# Patient Record
Sex: Male | Born: 1980 | Race: White | Hispanic: No | Marital: Married | Smoking: Former smoker
Health system: Southern US, Community
[De-identification: ages and names within clinical notes are randomized; demographics above are authoritative.]

## PROBLEM LIST (undated history)

## (undated) DIAGNOSIS — Q433 Congenital malformations of intestinal fixation: Secondary | ICD-10-CM

## (undated) DIAGNOSIS — J301 Allergic rhinitis due to pollen: Secondary | ICD-10-CM

## (undated) DIAGNOSIS — K649 Unspecified hemorrhoids: Secondary | ICD-10-CM

## (undated) DIAGNOSIS — L723 Sebaceous cyst: Secondary | ICD-10-CM

## (undated) HISTORY — DX: Unspecified hemorrhoids: K64.9

## (undated) HISTORY — PX: COLON SURGERY: SHX602

## (undated) HISTORY — DX: Allergic rhinitis due to pollen: J30.1

## (undated) HISTORY — PX: VOLVULUS REDUCTION: SHX425

## (undated) HISTORY — DX: Sebaceous cyst: L72.3

## (undated) HISTORY — DX: Congenital malformations of intestinal fixation: Q43.3

## (undated) HISTORY — PX: APPENDECTOMY: SHX54

---

## 2008-05-12 ENCOUNTER — Ambulatory Visit: Payer: Self-pay | Admitting: Family Medicine

## 2008-05-12 DIAGNOSIS — L723 Sebaceous cyst: Secondary | ICD-10-CM

## 2008-05-12 DIAGNOSIS — K649 Unspecified hemorrhoids: Secondary | ICD-10-CM | POA: Insufficient documentation

## 2008-05-15 ENCOUNTER — Ambulatory Visit: Payer: Self-pay | Admitting: Family Medicine

## 2008-05-16 ENCOUNTER — Encounter (INDEPENDENT_AMBULATORY_CARE_PROVIDER_SITE_OTHER): Payer: Self-pay | Admitting: *Deleted

## 2008-05-16 LAB — CONVERTED CEMR LAB
CO2: 32 meq/L (ref 19–32)
Chloride: 103 meq/L (ref 96–112)
Creatinine, Ser: 1.3 mg/dL (ref 0.4–1.5)
GFR calc non Af Amer: 70 mL/min
Potassium: 4.1 meq/L (ref 3.5–5.1)
Total CHOL/HDL Ratio: 3.8
Triglycerides: 47 mg/dL (ref 0–149)

## 2008-11-16 ENCOUNTER — Ambulatory Visit: Payer: Self-pay | Admitting: Family Medicine

## 2008-11-16 LAB — CONVERTED CEMR LAB: Rapid Strep: NEGATIVE

## 2008-12-15 ENCOUNTER — Telehealth: Payer: Self-pay | Admitting: Family Medicine

## 2008-12-15 ENCOUNTER — Telehealth (INDEPENDENT_AMBULATORY_CARE_PROVIDER_SITE_OTHER): Payer: Self-pay | Admitting: *Deleted

## 2008-12-21 ENCOUNTER — Encounter (INDEPENDENT_AMBULATORY_CARE_PROVIDER_SITE_OTHER): Payer: Self-pay | Admitting: *Deleted

## 2008-12-21 ENCOUNTER — Ambulatory Visit: Payer: Self-pay | Admitting: Family Medicine

## 2011-07-09 ENCOUNTER — Encounter: Payer: Self-pay | Admitting: Family Medicine

## 2011-07-10 ENCOUNTER — Ambulatory Visit: Payer: Self-pay | Admitting: Family Medicine

## 2012-02-12 ENCOUNTER — Telehealth: Payer: Self-pay

## 2012-02-12 NOTE — Telephone Encounter (Signed)
Pt wanting results from tetanus.  Originally called on 02/05/12.  Pt is in MedMan only, this is [place].  He will be coming in the office tomorrow.  [phone]

## 2012-02-12 NOTE — Telephone Encounter (Signed)
Can you please send this as a med man message? It should not be in epic.

## 2012-07-27 ENCOUNTER — Ambulatory Visit: Payer: Self-pay | Admitting: Family Medicine

## 2012-08-02 ENCOUNTER — Encounter: Payer: Self-pay | Admitting: Family Medicine

## 2012-08-03 NOTE — Progress Notes (Signed)
This encounter was created in error - please disregard.

## 2012-10-07 ENCOUNTER — Encounter: Payer: Self-pay | Admitting: Family Medicine

## 2012-10-08 NOTE — Progress Notes (Signed)
This encounter was created in error - please disregard.

## 2012-12-16 ENCOUNTER — Ambulatory Visit: Payer: Self-pay | Admitting: Family Medicine

## 2013-01-27 ENCOUNTER — Ambulatory Visit: Payer: Self-pay | Admitting: Family Medicine

## 2013-05-02 ENCOUNTER — Ambulatory Visit: Payer: Self-pay | Admitting: Family Medicine

## 2013-05-05 ENCOUNTER — Encounter: Payer: Self-pay | Admitting: Family Medicine

## 2013-05-05 ENCOUNTER — Ambulatory Visit (INDEPENDENT_AMBULATORY_CARE_PROVIDER_SITE_OTHER): Payer: 59 | Admitting: Family Medicine

## 2013-05-05 VITALS — BP 120/80 | HR 81 | Temp 97.5°F | Ht 70.0 in | Wt 204.0 lb

## 2013-05-05 DIAGNOSIS — Q433 Congenital malformations of intestinal fixation: Secondary | ICD-10-CM

## 2013-05-05 DIAGNOSIS — L723 Sebaceous cyst: Secondary | ICD-10-CM

## 2013-05-05 DIAGNOSIS — J301 Allergic rhinitis due to pollen: Secondary | ICD-10-CM

## 2013-05-05 DIAGNOSIS — Z13220 Encounter for screening for lipoid disorders: Secondary | ICD-10-CM

## 2013-05-05 DIAGNOSIS — Z131 Encounter for screening for diabetes mellitus: Secondary | ICD-10-CM

## 2013-05-05 NOTE — Patient Instructions (Signed)
Set up a lab appointment at your convenience

## 2013-05-05 NOTE — Progress Notes (Signed)
Pre-visit discussion using our clinic review tool. No additional management support is needed unless otherwise documented below in the visit note.  

## 2013-05-05 NOTE — Progress Notes (Signed)
Date:  05/05/2013   Name:  Larry White   DOB:  22-Oct-1980   MRN:  [id number] Gender: male Age: 33 y.o.  Primary Physician:  Hannah BeatSpencer Laymond Postle, MD   Chief Complaint: Establish Care   Subjective:   History of Present Illness:  Larry White is a 33 y.o. pleasant patient who presents with the following:  New patient. He is here to reestablish care. I haven't seen him a number of years. He is overall doing quite well. He does have some occasional problems with his ALLERGIES intermittent intermittently bothering him and flaring off and on throughout the year.  He also does have an enlarging cysts in his upper thoracic region that is likely either an epidermal cyst or a sebaceous cyst that has enlarged over time. It doesn't really hurt, but is bothersome to him. He would like to have this taken off on elective basis.  Sometimes a little dizzy.   Gets some bad sinuses and will get a cough.   Sebaceous cyst.   Patient Active Problem List   Diagnosis Date Noted  . 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 Allergic rhinitis due to pollen   . HEMORRHOIDS 05/12/2008  . EPIDERMOID CYST, BACK 05/12/2008    Past Medical History  Diagnosis Date  . Sebaceous cyst     back  . Unspecified hemorrhoids without mention of complication   . Allergic rhinitis due to pollen   . 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 Appendectomy  1982  . Volvulus reduction  1982    Probable volvulus in 1982 as infant    History   Social History  . Marital Status: Married    Spouse Name: N/A    Number of Children: 2  . Years of Education: N/A   Occupational History  . [place] [employer]   Social History Main Topics  . Smoking status: Former Games developermoker  . Smokeless tobacco: Never Used  . Alcohol Use: Yes     Comment: occ beer  . Drug Use: No  . Sexual Activity: Yes    Partners: Female   Other Topics Concern  . 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 Diabetes Father   . Hyperlipidemia Mother   . Hypertension Mother   . Hyperlipidemia Maternal Grandmother   . Heart disease Maternal Grandmother   . Emphysema Maternal Grandmother   . Heart disease Paternal Grandfather     No Known Allergies  Medication list has been reviewed and updated.  Review of Systems:   GEN: No acute illnesses, no fevers, chills. GI: No n/v/d, eating normally Pulm: No SOB Interactive and getting along well at home.  Otherwise, ROS is as per the HPI.  Objective:   Physical Examination: BP 120/80  Pulse 81  Temp(Src) 97.5 F (36.4 C) (Oral)  Ht 5\' 10"  (1.778 m)  Wt 204 lb (92.534 kg)  BMI 29.27 kg/m2  Ideal Body Weight: Weight in (lb) to have BMI = 25: 173.9   GEN: WDWN, NAD, Non-toxic, A & O x 3 HEENT: Atraumatic, Normocephalic. Neck supple. No masses, No LAD. Ears and Nose: No external deformity. CV: RRR, No M/G/R. No JVD. No thrill. No extra  heart sounds. PULM: CTA B, no wheezes, crackles, rhonchi. No retractions. No resp. distress. No accessory muscle use. EXTR: No c/c/e NEURO Normal gait.  PSYCH: Normally interactive. Conversant. Not depressed or anxious appearing.  Calm demeanor.   Laboratory and Imaging Data:  Assessment & Plan:    EPIDERMOID CYST, BACK  Screening for lipoid disorders - Plan: Lipid panel  Screening for diabetes mellitus - Plan: Basic metabolic panel  Volvulus neonatorum  Allergic rhinitis due to pollen  Is basically doing fine. Offered to set him up with a referral to have his cyst removed, but his wife has a dermatologist that he would like to see.  He also is having a little bit of some occasional dizziness as such, he hasn't had a lab work in about 5 years. We're going to get a basic BMP lipid panel to  see where his status is.  Patient Instructions  Set up a lab appointment at your convenience   Orders Placed This Encounter  Procedures  . Basic metabolic panel  . Lipid panel    New medications, updates to list, dose adjustments: No orders of the defined types were placed in this encounter.    Signed,  Elpidio Galea. Lanyah Spengler, MD, CAQ Sports Medicine  [facility] [address] [place] [address] Phone: [phone] Fax: [phone]    Medication List    Notice As of 05/05/2013 11:59 PM   You have not been prescribed any medications.

## 2013-05-06 ENCOUNTER — Encounter: Payer: Self-pay | Admitting: Family Medicine

## 2013-05-06 DIAGNOSIS — J301 Allergic rhinitis due to pollen: Secondary | ICD-10-CM | POA: Insufficient documentation

## 2013-05-06 DIAGNOSIS — Q433 Congenital malformations of intestinal fixation: Secondary | ICD-10-CM

## 2013-05-06 HISTORY — DX: Congenital malformations of intestinal fixation: Q43.3

## 2013-05-19 ENCOUNTER — Other Ambulatory Visit (INDEPENDENT_AMBULATORY_CARE_PROVIDER_SITE_OTHER): Payer: 59

## 2013-05-19 DIAGNOSIS — Z131 Encounter for screening for diabetes mellitus: Secondary | ICD-10-CM

## 2013-05-19 DIAGNOSIS — Z13220 Encounter for screening for lipoid disorders: Secondary | ICD-10-CM

## 2013-05-19 LAB — BASIC METABOLIC PANEL
BUN: 25 mg/dL — AB (ref 6–23)
CALCIUM: 9.4 mg/dL (ref 8.4–10.5)
CO2: 29 mEq/L (ref 19–32)
CREATININE: 1.1 mg/dL (ref 0.4–1.5)
Chloride: 104 mEq/L (ref 96–112)
GFR: 78.88 mL/min (ref >60.00)
GLUCOSE: 90 mg/dL (ref 70–99)
Potassium: 4.3 mEq/L (ref 3.5–5.1)
Sodium: 139 mEq/L (ref 135–145)

## 2013-05-19 LAB — LIPID PANEL
Cholesterol: 162 mg/dL (ref 0–200)
HDL: 51.4 mg/dL (ref >39.00)
LDL Cholesterol: 99 mg/dL (ref 0–99)
Total CHOL/HDL Ratio: 3
Triglycerides: 59 mg/dL (ref 0.0–149.0)
VLDL: 11.8 mg/dL (ref 0.0–40.0)

## 2013-05-20 ENCOUNTER — Encounter: Payer: Self-pay | Admitting: *Deleted

## 2013-10-28 ENCOUNTER — Encounter: Payer: Self-pay | Admitting: Internal Medicine

## 2013-10-28 ENCOUNTER — Ambulatory Visit (INDEPENDENT_AMBULATORY_CARE_PROVIDER_SITE_OTHER): Payer: 59 | Admitting: Internal Medicine

## 2013-10-28 VITALS — BP 118/72 | HR 74 | Temp 98.1°F | Wt 202.5 lb

## 2013-10-28 DIAGNOSIS — J069 Acute upper respiratory infection, unspecified: Secondary | ICD-10-CM

## 2013-10-28 MED ORDER — AZITHROMYCIN 250 MG PO TABS: ORAL_TABLET | ORAL | Status: DC

## 2013-10-28 NOTE — Progress Notes (Signed)
HPI  Pt presents to the clinic today with c/o cough and sore throat. He reports this started about 2 weeks ago. The cough is productive of thick green sputum. He has had some associated fatigue and headache. He denies fever, chills or body aches. He has tried Mucinex without relief. He does feel like his symptoms are getting worse. He does have a history of seasonal allergies. He takes Claritin when he needs to. He has no history of breathing problems. He has had sick contacts.  Review of Systems      Past Medical History  Diagnosis Date  . Sebaceous cyst     back  . Unspecified hemorrhoids without mention of complication   . Allergic rhinitis due to pollen   . 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 Diabetes Father   . Hyperlipidemia Mother   . Hypertension Mother   . Hyperlipidemia Maternal Grandmother   . Heart disease Maternal Grandmother   . Emphysema Maternal Grandmother   . Heart disease Paternal Grandfather     History   Social History  . Marital Status: Married    Spouse Name: N/A    Number of Children: 2  . Years of Education: N/A   Occupational History  . paramedic [employer]   Social History Main Topics  . Smoking status: Former Games developer  . Smokeless tobacco: Never Used  . Alcohol Use: Yes     Comment: occ beer  . Drug Use: No  . Sexual Activity: Yes    Partners: Female   Other Topics Concern  . 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 Denies fever or abrupt weight changes.  HEENT:  Positive sore throat. Denies eye redness, eye pain, pressure behind the eyes, facial pain, nasal congestion, ear pain, ringing in the ears, wax buildup, runny nose or bloody nose. Respiratory: Positive cough. Denies difficulty  breathing or shortness of breath.  Cardiovascular: Denies chest pain, chest tightness, palpitations or swelling in the hands or feet.   No other specific complaints in a complete review of systems (except as listed in HPI above).  Objective:   BP 118/72  Pulse 74  Temp(Src) 98.1 F (36.7 C) (Oral)  Wt 202 lb 8 oz (91.853 kg)  SpO2 99% Wt Readings from Last 3 Encounters:  10/28/13 202 lb 8 oz (91.853 kg)  05/05/13 204 lb (92.534 kg)  11/16/08 178 lb 12.8 oz (81.103 kg)     General: Appears his stated age, well developed, well nourished in NAD. HEENT: Head: normal shape and size; Eyes: sclera white, no icterus, conjunctiva pink, PERRLA and EOMs intact; Ears: Tm's gray and intact, normal light reflex; Nose: mucosa pink and moist, septum midline; Throat/Mouth: + PND. Teeth present, mucosa erythematous and moist, no exudate noted, no lesions or ulcerations noted.  Neck: Mild cervical lymphadenopathy. Neck supple, trachea midline. No massses, lumps or thyromegaly present.  Cardiovascular: Normal rate and rhythm. S1,S2 noted.  No murmur, rubs or gallops noted. No JVD or BLE edema. No carotid bruits noted. Pulmonary/Chest: Normal effort and positive vesicular breath sounds. No respiratory distress. No wheezes, rales or ronchi noted.      Assessment & Plan:   Upper Respiratory  Infection:  Get some rest and drink plenty of water Do salt water gargles for the sore throat eRx for Azithromax x 5 days Delsym OTC for cough  RTC as needed or if symptoms persist.

## 2013-10-28 NOTE — Progress Notes (Signed)
Pre visit review using our clinic review tool, if applicable. No additional management support is needed unless otherwise documented below in the visit note. 

## 2013-10-28 NOTE — Patient Instructions (Signed)
Upper Respiratory Infection, Adult An upper respiratory infection (URI) is also sometimes known as the common cold. The upper respiratory tract includes the nose, sinuses, throat, trachea, and bronchi. Bronchi are the airways leading to the lungs. Most people improve within 1 week, but symptoms can last up to 2 weeks. A residual cough may last even longer.  CAUSES Many different viruses can infect the tissues lining the upper respiratory tract. The tissues become irritated and inflamed and often become very moist. Mucus production is also common. A cold is contagious. You can easily spread the virus to others by oral contact. This includes kissing, sharing a glass, coughing, or sneezing. Touching your mouth or nose and then touching a surface, which is then touched by another person, can also spread the virus. SYMPTOMS  Symptoms typically develop 1 to 3 days after you come in contact with a cold virus. Symptoms vary from person to person. They may include:  Runny nose.  Sneezing.  Nasal congestion.  Sinus irritation.  Sore throat.  Loss of voice (laryngitis).  Cough.  Fatigue.  Muscle aches.  Loss of appetite.  Headache.  Low-grade fever. DIAGNOSIS  You might diagnose your own cold based on familiar symptoms, since most people get a cold 2 to 3 times a year. Your caregiver can confirm this based on your exam. Most importantly, your caregiver can check that your symptoms are not due to another disease such as strep throat, sinusitis, pneumonia, asthma, or epiglottitis. Blood tests, throat tests, and X-rays are not necessary to diagnose a common cold, but they may sometimes be helpful in excluding other more serious diseases. Your caregiver will decide if any further tests are required. RISKS AND COMPLICATIONS  You may be at risk for a more severe case of the common cold if you smoke cigarettes, have chronic heart disease (such as heart failure) or lung disease (such as asthma), or if  you have a weakened immune system. The very young and very old are also at risk for more serious infections. Bacterial sinusitis, middle ear infections, and bacterial pneumonia can complicate the common cold. The common cold can worsen asthma and chronic obstructive pulmonary disease (COPD). Sometimes, these complications can require emergency medical care and may be life-threatening. PREVENTION  The best way to protect against getting a cold is to practice good hygiene. Avoid oral or hand contact with people with cold symptoms. Wash your hands often if contact occurs. There is no clear evidence that vitamin C, vitamin E, echinacea, or exercise reduces the chance of developing a cold. However, it is always recommended to get plenty of rest and practice good nutrition. TREATMENT  Treatment is directed at relieving symptoms. There is no cure. Antibiotics are not effective, because the infection is caused by a virus, not by bacteria. Treatment may include:  Increased fluid intake. Sports drinks offer valuable electrolytes, sugars, and fluids.  Breathing heated mist or steam (vaporizer or shower).  Eating chicken soup or other clear broths, and maintaining good nutrition.  Getting plenty of rest.  Using gargles or lozenges for comfort.  Controlling fevers with ibuprofen or acetaminophen as directed by your caregiver.  Increasing usage of your inhaler if you have asthma. Zinc gel and zinc lozenges, taken in the first 24 hours of the common cold, can shorten the duration and lessen the severity of symptoms. Pain medicines may help with fever, muscle aches, and throat pain. A variety of non-prescription medicines are available to treat congestion and runny nose. Your caregiver   can make recommendations and may suggest nasal or lung inhalers for other symptoms.  HOME CARE INSTRUCTIONS   Only take over-the-counter or prescription medicines for pain, discomfort, or fever as directed by your  caregiver.  Use a warm mist humidifier or inhale steam from a shower to increase air moisture. This may keep secretions moist and make it easier to breathe.  Drink enough water and fluids to keep your urine clear or pale yellow.  Rest as needed.  Return to work when your temperature has returned to normal or as your caregiver advises. You may need to stay home longer to avoid infecting others. You can also use a face mask and careful hand washing to prevent spread of the virus. SEEK MEDICAL CARE IF:   After the first few days, you feel you are getting worse rather than better.  You need your caregiver's advice about medicines to control symptoms.  You develop chills, worsening shortness of breath, or brown or red sputum. These may be signs of pneumonia.  You develop yellow or brown nasal discharge or pain in the face, especially when you bend forward. These may be signs of sinusitis.  You develop a fever, swollen neck glands, pain with swallowing, or white areas in the back of your throat. These may be signs of strep throat. SEEK IMMEDIATE MEDICAL CARE IF:   You have a fever.  You develop severe or persistent headache, ear pain, sinus pain, or chest pain.  You develop wheezing, a prolonged cough, cough up blood, or have a change in your usual mucus (if you have chronic lung disease).  You develop sore muscles or a stiff neck. Document Released: 09/24/2000 Document Revised: 06/23/2011 Document Reviewed: 08/02/2010 ExitCare Patient Information 2015 [employer]. This information is not intended to replace advice given to you by your health care provider. Make sure you discuss any questions you have with your health care provider.  

## 2014-02-21 ENCOUNTER — Encounter (HOSPITAL_BASED_OUTPATIENT_CLINIC_OR_DEPARTMENT_OTHER): Payer: Self-pay

## 2014-02-21 ENCOUNTER — Emergency Department (HOSPITAL_BASED_OUTPATIENT_CLINIC_OR_DEPARTMENT_OTHER)
Admission: EM | Admit: 2014-02-21 | Discharge: 2014-02-21 | Disposition: A | Discharge status: Home / Self Care | Payer: 59 | Attending: Emergency Medicine | Admitting: Emergency Medicine

## 2014-02-21 DIAGNOSIS — Z9089 Acquired absence of other organs: Secondary | ICD-10-CM | POA: Diagnosis not present

## 2014-02-21 DIAGNOSIS — R109 Unspecified abdominal pain: Secondary | ICD-10-CM | POA: Insufficient documentation

## 2014-02-21 DIAGNOSIS — R1030 Lower abdominal pain, unspecified: Secondary | ICD-10-CM

## 2014-02-21 LAB — URINALYSIS, ROUTINE W REFLEX MICROSCOPIC
BILIRUBIN URINE: NEGATIVE
Glucose, UA: NEGATIVE mg/dL
HGB URINE DIPSTICK: NEGATIVE
Ketones, ur: NEGATIVE mg/dL
Leukocytes, UA: NEGATIVE
NITRITE: NEGATIVE
PH: 7.5 (ref 5.0–8.0)
Protein, ur: NEGATIVE mg/dL
Specific Gravity, Urine: 1.017 (ref 1.005–1.030)
Urobilinogen, UA: 0.2 mg/dL (ref 0.0–1.0)

## 2014-02-21 LAB — COMPREHENSIVE METABOLIC PANEL
ALBUMIN: 4.2 g/dL (ref 3.5–5.2)
ALK PHOS: 57 U/L (ref 39–117)
ALT: 48 U/L (ref 0–53)
AST: 31 U/L (ref 0–37)
Anion gap: 13 (ref 5–15)
BILIRUBIN TOTAL: 0.4 mg/dL (ref 0.3–1.2)
BUN: 15 mg/dL (ref 6–23)
CHLORIDE: 101 meq/L (ref 96–112)
CO2: 28 mEq/L (ref 19–32)
Calcium: 9.7 mg/dL (ref 8.4–10.5)
Creatinine, Ser: 1 mg/dL (ref 0.50–1.35)
GFR calc Af Amer: >90 mL/min (ref >90)
GFR calc non Af Amer: >90 mL/min (ref >90)
Glucose, Bld: 131 mg/dL — ABNORMAL HIGH (ref 70–99)
POTASSIUM: 3.6 meq/L — AB (ref 3.7–5.3)
Sodium: 142 mEq/L (ref 137–147)
Total Protein: 7.7 g/dL (ref 6.0–8.3)

## 2014-02-21 LAB — CBC WITH DIFFERENTIAL/PLATELET
BASOS PCT: 0 % (ref 0–1)
Basophils Absolute: 0 10*3/uL (ref 0.0–0.1)
EOS ABS: 0.1 10*3/uL (ref 0.0–0.7)
Eosinophils Relative: 0 % (ref 0–5)
HCT: 42.4 % (ref 39.0–52.0)
Hemoglobin: 14 g/dL (ref 13.0–17.0)
Lymphocytes Relative: 10 % — ABNORMAL LOW (ref 12–46)
Lymphs Abs: 1.1 10*3/uL (ref 0.7–4.0)
MCH: 29.4 pg (ref 26.0–34.0)
MCHC: 33 g/dL (ref 30.0–36.0)
MCV: 89.1 fL (ref 78.0–100.0)
Monocytes Absolute: 0.6 10*3/uL (ref 0.1–1.0)
Monocytes Relative: 5 % (ref 3–12)
NEUTROS ABS: 9.4 10*3/uL — AB (ref 1.7–7.7)
NEUTROS PCT: 85 % — AB (ref 43–77)
PLATELETS: 186 10*3/uL (ref 150–400)
RBC: 4.76 MIL/uL (ref 4.22–5.81)
RDW: 12.5 % (ref 11.5–15.5)
WBC: 11.2 10*3/uL — ABNORMAL HIGH (ref 4.0–10.5)

## 2014-02-21 LAB — LIPASE, BLOOD: Lipase: 18 U/L (ref 11–59)

## 2014-02-21 NOTE — ED Notes (Signed)
Pt from [facility] with lower right abdominal pain after eating. Radiating to back. Diaphoresis. VSS. 18G LAC. 4mg  zofran.

## 2014-02-21 NOTE — Discharge Instructions (Signed)

## 2014-02-21 NOTE — ED Provider Notes (Signed)
CSN: [id number]     Arrival date & time 02/21/14  1403 History   First MD Initiated Contact with Patient 02/21/14 1431     Chief Complaint  Patient presents with  . Abdominal Pain     (Consider location/radiation/quality/duration/timing/severity/associated sxs/prior Treatment) HPI Comments: This is a 33 year old male who presents to the emergency department via EMS complaining of lower abdominal pain radiating across his lower abdomen occurring shortly after eating lunch was a sandwich. Pain described as sharp and radiating towards his lower back, intermittent, coming and going at random, lasting about 45 minutes. 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 He reports trying to have a bowel movement which he did that did not relieve his pain. No diarrhea. He reports he felt diaphoretic.His boss then started an IV, gave him Zofran and put him in the ambulance truck when he then got a wave of cool air which made him feel much better. Currently he is pain-free. Denies nausea. Earlier today he was feeling fine. No vomiting. History of appendectomy. Denies any urinary or GU symptoms.  Patient is a 33 y.o. male presenting with abdominal pain. The history is provided by the patient and the EMS personnel.  Abdominal Pain   History reviewed. No pertinent past medical history. Past Surgical History  Procedure Laterality Date  . Appendectomy    . Colon surgery     History reviewed. No pertinent family history. History  Substance Use Topics  . Smoking status: Never Smoker   . Smokeless tobacco: Not on file  . Alcohol Use: No    Review of Systems  Gastrointestinal: Positive for abdominal pain.   10 Systems reviewed and are negative for acute change except as noted in the HPI.   Allergies  Review of patient's allergies indicates no known allergies.  Home Medications   Prior to Admission medications   Not on File   BP 139/75 mmHg  Pulse 64  Temp(Src) 98.6 F (37 C) (Oral)  Resp 20   SpO2 100% Physical Exam  Constitutional: He is oriented to person, place, and time. He appears well-developed and well-nourished. No distress.  HENT:  Head: Normocephalic and atraumatic.  Mouth/Throat: Oropharynx is clear and moist.  Eyes: Conjunctivae are normal. No scleral icterus.  Neck: Normal range of motion. Neck supple.  Cardiovascular: Normal rate, regular rhythm and normal heart sounds.   Pulmonary/Chest: Effort normal and breath sounds normal.  Abdominal: Soft. Bowel sounds are normal. He exhibits no distension. There is no tenderness. There is no rebound and no guarding.  Musculoskeletal: Normal range of motion. He exhibits no edema.  Neurological: He is alert and oriented to person, place, and time.  Skin: Skin is warm and dry. He is not diaphoretic.  Psychiatric: He has a normal mood and affect. His behavior is normal.  Nursing note and vitals reviewed.   ED Course  Procedures (including critical care time) Labs Review Labs Reviewed  CBC WITH DIFFERENTIAL - Abnormal; Notable for the following:    WBC 11.2 (*)    Neutrophils Relative % 85 (*)    Neutro Abs 9.4 (*)    Lymphocytes Relative 10 (*)    All other components within normal limits  COMPREHENSIVE METABOLIC PANEL - Abnormal; Notable for the following:    Potassium 3.6 (*)    Glucose, Bld 131 (*)    All other components within normal limits  URINALYSIS, ROUTINE W REFLEX MICROSCOPIC  LIPASE, BLOOD    Imaging Review No results found.  EKG Interpretation None      MDM   Final diagnoses:  Lower abdominal pain   Patient nontoxic appearing and in no apparent distress. Afebrile, vital signs stable. Labs obtained prior to patient being seen, leukocytosis of 11.2, otherwise no acute findings. Urinalysis negative for any blood, doubt kidney stone. Abdominal pain was lower, history of appendectomy. Abdomen soft and nontender on exam. Possible that he had gas causing his pain. He is stable for discharge.  Return precautions given. Patient states understanding of treatment care plan and is agreeable.  Kathrynn SpeedRobyn M Illias Pantano, PA-C 02/21/14 1534  Purvis SheffieldForrest Harrison, MD 02/22/14 [phone]

## 2014-02-22 ENCOUNTER — Encounter: Payer: Self-pay | Admitting: Internal Medicine

## 2014-02-23 ENCOUNTER — Ambulatory Visit (INDEPENDENT_AMBULATORY_CARE_PROVIDER_SITE_OTHER): Payer: 59 | Admitting: Family Medicine

## 2014-02-23 ENCOUNTER — Encounter: Payer: Self-pay | Admitting: Family Medicine

## 2014-02-23 VITALS — BP 120/80 | HR 80 | Temp 98.0°F | Ht 70.0 in | Wt 203.5 lb

## 2014-02-23 DIAGNOSIS — J0110 Acute frontal sinusitis, unspecified: Secondary | ICD-10-CM

## 2014-02-23 MED ORDER — AMOXICILLIN-POT CLAVULANATE 875-125 MG PO TABS: 1.0000 | ORAL_TABLET | Freq: Two times a day (BID) | ORAL | Status: DC

## 2014-02-23 NOTE — Progress Notes (Signed)
Pre visit review using our clinic review tool, if applicable. No additional management support is needed unless otherwise documented below in the visit note. 

## 2014-02-23 NOTE — Progress Notes (Signed)
   Dr. Karleen HampshireSpencer T. Shana Zavaleta, MD, CAQ Sports Medicine Primary Care and Sports Medicine [address] [place], [id number] Phone: [phone] Fax: [phone]  02/23/2014  Patient: Larry White, MRN: [id number], DOB: 1980-09-10, 33 y.o.  Primary Physician:  Hannah BeatSpencer Allani Reber, MD  Chief Complaint: Sinusitis and Cough  Subjective:   Larry White is a 33 y.o. very pleasant male patient who presents with the following:  Sinus pain and congestion.  Ear is popping.  Frontal sinus pain.  4 weeks.  Very nice patient who initially started to have some upper respiratory infection type symptoms approximately 1 month ago.  He has had continued and ongoing worsening sinus pressure and pain, more in the frontal sinuses.  He also has had some ear popping, some runny nose, postnasal drip, and coughing.  At this point, it has localized to more sinus pressure and pain.  Past Medical History, Surgical History, Social History, Family History, Problem List, Medications, and Allergies have been reviewed and updated if relevant.  ROS: GEN: Acute illness details above GI: Tolerating PO intake GU: maintaining adequate hydration and urination Pulm: No SOB Interactive and getting along well at home.  Otherwise, ROS is as per the HPI.   Objective:   BP 120/80 mmHg  Pulse 80  Temp(Src) 98 F (36.7 C) (Oral)  Ht 5\' 10"  (1.778 m)  Wt 203 lb 8 oz (92.307 kg)  BMI 29.20 kg/m2  SpO2 96%   Gen: WDWN, NAD; alert,appropriate and cooperative throughout exam  HEENT: Normocephalic and atraumatic. Throat clear, w/o exudate, no LAD, R TM clear, L TM - good landmarks, No fluid present. rhinnorhea.  Left frontal and maxillary sinuses: Tender, frontal Right frontal and maxillary sinuses: Tender, frontal  Neck: No ant or post LAD CV: RRR, No M/G/R Pulm: Breathing comfortably in no resp distress. no w/c/r Abd: S,NT,ND,+BS Extr: no c/c/e Psych: full affect, pleasant    Laboratory and Imaging  Data:  Assessment and Plan:   Acute frontal sinusitis, recurrence not specified  Acute sinusitis: ABX as below.   Reviewed symptomatic care as well as ABX in this case.   Follow-up: No Follow-up on file.  New Prescriptions   AMOXICILLIN-CLAVULANATE (AUGMENTIN) 875-125 MG PER TABLET    Take 1 tablet by mouth 2 (two) times daily.   No orders of the defined types were placed in this encounter.    Signed,  Elpidio GaleaSpencer T. Savaughn Karwowski, MD   Patient's Medications  New Prescriptions   AMOXICILLIN-CLAVULANATE (AUGMENTIN) 875-125 MG PER TABLET    Take 1 tablet by mouth 2 (two) times daily.  Previous Medications   No medications on file  Modified Medications   No medications on file  Discontinued Medications   AZITHROMYCIN (ZITHROMAX) 250 MG TABLET    Take 2 tabs today, then 1 tab daily x 4 days   DEXTROMETHORPHAN-GUAIFENESIN (MUCINEX DM) 30-600 MG PER 12 HR TABLET    Take 1 tablet by mouth 2 (two) times daily.

## 2014-12-13 ENCOUNTER — Ambulatory Visit (INDEPENDENT_AMBULATORY_CARE_PROVIDER_SITE_OTHER): Payer: Worker's Compensation | Admitting: Family Medicine

## 2014-12-13 ENCOUNTER — Ambulatory Visit: Payer: Worker's Compensation

## 2014-12-13 VITALS — BP 118/76 | HR 71 | Temp 98.5°F | Resp 16 | Ht 71.0 in | Wt 201.0 lb

## 2014-12-13 DIAGNOSIS — S39012A Strain of muscle, fascia and tendon of lower back, initial encounter: Secondary | ICD-10-CM | POA: Diagnosis not present

## 2014-12-13 DIAGNOSIS — M62830 Muscle spasm of back: Secondary | ICD-10-CM

## 2014-12-13 MED ORDER — CYCLOBENZAPRINE HCL 10 MG PO TABS: 10.0000 mg | ORAL_TABLET | Freq: Two times a day (BID) | ORAL | Status: DC | PRN

## 2014-12-13 NOTE — Patient Instructions (Signed)
We will keep you on light duty until this coming Monday However if you are better and want to get back to full duty sooner just come in to see [place] sooner Otherwise we will see you on Monday 12/18/2014.   Use flexeril as needed for pain at night- however do not use it when you are driving Also ok to use ibuprofen, heat as needed

## 2014-12-13 NOTE — Progress Notes (Signed)
Larry White 1980-09-02 34 y.o.   Chief Complaint  Patient presents with  . Back Injury    rt. lower, x 2 day Workers Comp     Date of Injury: 12/12/2014  History of Present Illness:  Presents for evaluation of work-related complaint. He works as an Production manager On 8/29 he was helping a pt out of a vehicle- she was overweight and did not cooperate very well so the transfer was hard on his back.  He felt some pain but he kept on going, hoped he would be ok. Yesterday he was working again and lifted a pt. Felt a sharp pain in his right lower back.   The pain is staying in the right lower back, and does radiate round to the right flank and a bit to the left lower back.  However it does not go into his legs.   No numbness or weakness of the legs No bowel or bladder incotn He is generally in good health He has tried some ibuprofen, menthol wrap/ ace bandage He used to work at [employer]- had a pinched nerve in his back when he was 22, saw a Land and got better.  Never needed surgery.   He does see his personal chiropractor when he has back trouble and plans to do so today ROS    No Known Allergies   Current medications reviewed and updated. Past medical history, family history, social history have been reviewed and updated.   Physical Exam Filed Vitals:   12/13/14 0836  BP: 118/76  Pulse: 71  Temp: 98.5 F (36.9 C)  Resp: 16    GEN: WDWN, NAD, Non-toxic, A & O x 3, looks well HEENT: Atraumatic, Normocephalic. Neck supple. No masses, No LAD. Ears and Nose: No external deformity. CV: RRR, No M/G/R. No JVD. No thrill. No extra heart sounds. PULM: CTA B, no wheezes, crackles, rhonchi. No retractions. No resp. distress. No accessory muscle use. EXTR: No c/c/e NEURO Normal gait.  PSYCH: Normally interactive. Conversant. Not depressed or anxious appearing.  Calm demeanor.  Back- he has tenderness and spasm in the right sided paralumbar muscles.  No bony TTP Slightly  reduced lumbar flexion and extension due to pain Normal BLE strength, sensation and DTR.  Negative SLR bilaterally   [facility] reading (PRIMARY) by  Dr. Patsy Lager. Lumbar spine: loss of lordosis, mild narrowing at L5/S1.   LUMBAR SPINE - 2-3 VIEW  COMPARISON: None.  FINDINGS: Normal lumbar segmentation. Vertebral height and alignment is normal except for straightening of lordosis. Mild disc space loss at L5-S1 and L4-L5. Bone mineralization is within normal limits. Sacral ala and SI joints within normal limits.  IMPRESSION: Mild disc space loss at L4-L5 and L5-S1 suggesting a degree of disc degeneration. No acute osseous abnormality identified.  Assessment and Plan: Lumbar strain, initial encounter - Plan: DG Lumbar Spine 2-3 Views  Lumbar paraspinal muscle spasm - Plan: cyclobenzaprine (FLEXERIL) 10 MG tablet  Lumbar strain at work.  He is eager to return to full duty but agrees that heavy lifting may further damage his back He will be on light duty until this coming Monday.  Flexeril if needed for pain See patient instructions for more details.

## 2014-12-18 ENCOUNTER — Ambulatory Visit (INDEPENDENT_AMBULATORY_CARE_PROVIDER_SITE_OTHER): Payer: Worker's Compensation | Admitting: Family Medicine

## 2014-12-18 VITALS — BP 118/80 | HR 77 | Temp 98.2°F | Resp 16 | Ht 71.0 in | Wt 202.0 lb

## 2014-12-18 DIAGNOSIS — S39012D Strain of muscle, fascia and tendon of lower back, subsequent encounter: Secondary | ICD-10-CM | POA: Diagnosis not present

## 2014-12-18 NOTE — Progress Notes (Signed)
Patient ID: Larry White, male   DOB: 01/23/1981, 34 y.o.   MRN: [id number]  This chart was scribed for Elvina Sidle, MD by Charline Bills, ED Scribe. The patient was seen in room 11. Patient's care was started at 12:17 PM.  Patient ID: Larry White MRN: [id number], DOB: September 24, 1980, 34 y.o. Date of Encounter: 12/18/2014, 12:17 PM  Primary Physician: Hannah Beat, MD   Chief Complaint  Patient presents with   Follow-up    W/C - recheck back injury    HPI: 34 y.o. year old male with history below presents with a worker's comp follow-up. Pt works for EMS. He states that he was lifting a pt and strained his back. He still reports mild soreness and some stiffness but states that he has been followed up with a chiropractor with significant relief. He requests a note to return to work at this time.   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 Authorizing Provider  cyclobenzaprine (FLEXERIL) 10 MG tablet Take 1 tablet (10 mg total) by mouth 2 (two) times daily as needed for muscle spasms. 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 (boy and girl)   5 and 3 as of 2015   Regular exercise - faded some now     Review of Systems: Constitutional: negative for chills, fever, night sweats, weight changes, or fatigue  HEENT: negative for vision changes, hearing loss, congestion, rhinorrhea, ST, epistaxis, or sinus pressure Cardiovascular: negative for chest pain or palpitations Respiratory: negative for hemoptysis, wheezing, shortness of breath, or cough Abdominal: negative for abdominal pain, nausea, vomiting, diarrhea, or constipation Msk: + back pain (mild, improving) Dermatological: negative for rash Neurologic: negative for headache, dizziness, or syncope All other systems reviewed and are otherwise negative with the exception to those above and in the HPI.  Physical Exam: Blood pressure 118/80, pulse 77, temperature 98.2 F (36.8 C), temperature source Oral, resp. rate 16, height 5\' 11"  (1.803 m), weight 202 lb (91.627 kg), SpO2 98 %., Body mass index is 28.19 kg/(m^2). General: Well developed, well nourished, in no acute distress. Head: Normocephalic, atraumatic, eyes without discharge, sclera non-icteric, nares are without discharge. Bilateral auditory canals clear, TM's are without perforation, pearly grey and translucent with reflective cone of light bilaterally. Oral cavity moist, posterior pharynx without exudate, erythema, peritonsillar abscess, or post nasal drip.  Neck: Supple. No thyromegaly. Full ROM. No  lymphadenopathy. Lungs: Clear bilaterally to auscultation without wheezes, rales, or rhonchi. Breathing is unlabored. Heart: RRR with S1 S2. No murmurs, rubs, or gallops appreciated. Abdomen: Soft, non-tender, non-distended with normoactive bowel sounds. No hepatomegaly. No rebound/guarding. No obvious abdominal masses. Msk:  Strength and tone normal for age. Normal ROM. No bony tenderness of the lumbar spine. Good muscular definition of back. Normal straight leg raising.  Extremities/Skin:  Warm and dry. No clubbing or cyanosis. No edema. No rashes or suspicious lesions. Neuro: Alert and oriented X 3. Moves all extremities spontaneously. Gait is normal. CNII-XII grossly in tact. Psych:  Responds to questions appropriately with a normal affect.     ASSESSMENT AND PLAN:  34 y.o. year old male with  1. Lumbar strain, subsequent encounter   resolved.  Signed, Elvina Sidle, MD 12/18/2014 12:17 PM

## 2015-01-16 ENCOUNTER — Encounter: Payer: Self-pay | Admitting: Primary Care

## 2015-01-16 ENCOUNTER — Ambulatory Visit (INDEPENDENT_AMBULATORY_CARE_PROVIDER_SITE_OTHER): Payer: 59 | Admitting: Primary Care

## 2015-01-16 VITALS — BP 134/82 | HR 85 | Temp 98.0°F | Ht 71.0 in | Wt 200.8 lb

## 2015-01-16 DIAGNOSIS — J019 Acute sinusitis, unspecified: Secondary | ICD-10-CM | POA: Diagnosis not present

## 2015-01-16 MED ORDER — AMOXICILLIN-POT CLAVULANATE 875-125 MG PO TABS: 1.0000 | ORAL_TABLET | Freq: Two times a day (BID) | ORAL | Status: DC

## 2015-01-16 NOTE — Progress Notes (Signed)
Subjective:    Patient ID: Larry White, male    DOB: May 18, 1980, 34 y.o.   MRN: [id number]  HPI  Larry White is a 34 year old male who presents today with a chief complaint of nasal congestion. His symptoms began with fatigue, body aches, sore throat, low grade, night sweats, and fever 2 weeks ago. He started feeling slightly better early last week, but starting Sunday night he developed sinus pressure to the right maxillary sinus,  frontal headache, nasal congestion with green discharge, and increased fatigue. Denies fever, chills, cough. He's taken tylenol and ibuprofen with temporary relief.   Review of Systems  Constitutional: Positive for fever and fatigue. Negative for chills.  HENT: Positive for congestion, sinus pressure and sneezing. Negative for ear pain, postnasal drip and sore throat.   Respiratory: Negative for cough and shortness of breath.   Cardiovascular: Negative for chest pain.  Musculoskeletal: Positive for myalgias.  Neurological: Positive for headaches.       Past Medical History  Diagnosis Date  . Sebaceous cyst     back  . Unspecified hemorrhoids without mention of complication   . Allergic rhinitis due to pollen   . 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 Marital Status: Married    Spouse Name: N/A  . Number of Children: 2  . 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 Smoking status: Never Smoker   . Smokeless tobacco: Never Used  . Alcohol Use: 0.0 oz/week    0 Standard drinks or equivalent per week     Comment: occ beer  . Drug Use: No  . Sexual Activity:    Partners: Female   Other Topics Concern  . Not on file   Social History Narrative   ** Merged History Encounter **       Paramedic   Married with 2 small children. (boy and girl)   5 and 3 as of 2015   Regular exercise - faded some now    Past Surgical History    Procedure Laterality Date  . Appendectomy  1982  . Volvulus reduction  1982    Probable volvulus in 1982 as infant  . Appendectomy    . Colon surgery      Family History  Problem Relation Age of Onset  . Diabetes Father   . Hyperlipidemia Mother   . Hypertension Mother   . Hyperlipidemia Maternal Grandmother   . Heart disease Maternal Grandmother   . Emphysema Maternal Grandmother   . Heart disease Paternal Grandfather     No Known Allergies  No current outpatient prescriptions on file prior to visit.   No current facility-administered medications on file prior to visit.    BP 134/82 mmHg  Pulse 85  Temp(Src) 98 F (36.7 C) (Oral)  Ht  (1.803 m)  Wt 200 lb 12.8 oz (91.082 kg)  BMI 28.02 kg/m2  SpO2 98%     Objective:   Physical Exam  Constitutional: He appears ill.  HENT:  Right Ear: Tympanic membrane and ear canal normal.  Left Ear: Tympanic membrane and ear canal normal.  Nose: Right sinus exhibits maxillary sinus tenderness and frontal sinus tenderness. Left sinus exhibits no maxillary sinus tenderness and no frontal sinus tenderness.  Mouth/Throat: Posterior oropharyngeal erythema present. No oropharyngeal exudate or posterior oropharyngeal edema.  Dullness to bilateral TM's  Eyes: Conjunctivae are normal. Pupils are equal, round, and reactive to light.  Neck: Neck supple.  Cardiovascular: Normal rate and regular rhythm.   Pulmonary/Chest: Effort normal and breath sounds normal.  Lymphadenopathy:    He has no cervical adenopathy.  Skin: Skin is warm and dry.          Assessment & Plan:  Sinusitis:  Flulike symptoms for 1 week, slight improvement, then sinus pressure, fatigue, frontal headache. Also blowing out green mucous from nasal cavity. Exam with moderate tenderness to right maxillary and frontal sinuses. RX for Augmentin BID x 10 days. Fluids, rest, ibuprofen or tylenol PRN. Follow up PRN

## 2015-01-16 NOTE — Progress Notes (Signed)
Pre visit review using our clinic review tool, if applicable. No additional management support is needed unless otherwise documented below in the visit note. 

## 2015-01-16 NOTE — Patient Instructions (Signed)
Start Augmentin antibiotic. Take 1 tablet by mouth twice daily for 10 days.  Increase intake of fluids. You need 2 liters of water daily. You may continue ibuprofen or tylenol for headaches and fevers.  Follow up if no improvement in the next 3-4 days.  It was a pleasure to see you today!  Sinusitis Sinusitis is redness, soreness, and inflammation of the paranasal sinuses. Paranasal sinuses are air pockets within the bones of your face (beneath the eyes, the middle of the forehead, or above the eyes). In healthy paranasal sinuses, mucus is able to drain out, and air is able to circulate through them by way of your nose. However, when your paranasal sinuses are inflamed, mucus and air can become trapped. This can allow bacteria and other germs to grow and cause infection. Sinusitis can develop quickly and last only a short time (acute) or continue over a long period (chronic). Sinusitis that lasts for more than 12 weeks is considered chronic.  CAUSES  Causes of sinusitis include:  Allergies.  Structural abnormalities, such as displacement of the cartilage that separates your nostrils (deviated septum), which can decrease the air flow through your nose and sinuses and affect sinus drainage.  Functional abnormalities, such as when the small hairs (cilia) that line your sinuses and help remove mucus do not work properly or are not present. SIGNS AND SYMPTOMS  Symptoms of acute and chronic sinusitis are the same. The primary symptoms are pain and pressure around the affected sinuses. Other symptoms include:  Upper toothache.  Earache.  Headache.  Bad breath.  Decreased sense of smell and taste.  A cough, which worsens when you are lying flat.  Fatigue.  Fever.  Thick drainage from your nose, which often is green and may contain pus (purulent).  Swelling and warmth over the affected sinuses. DIAGNOSIS  Your health care provider will perform a physical exam. During the exam, your  health care provider may:  Look in your nose for signs of abnormal growths in your nostrils (nasal polyps).  Tap over the affected sinus to check for signs of infection.  View the inside of your sinuses (endoscopy) using an imaging device that has a light attached (endoscope). If your health care provider suspects that you have chronic sinusitis, one or more of the following tests may be recommended:  Allergy tests.  Nasal culture. A sample of mucus is taken from your nose, sent to a lab, and screened for bacteria.  Nasal cytology. A sample of mucus is taken from your nose and examined by your health care provider to determine if your sinusitis is related to an allergy. TREATMENT  Most cases of acute sinusitis are related to a viral infection and will resolve on their own within 10 days. Sometimes medicines are prescribed to help relieve symptoms (pain medicine, decongestants, nasal steroid sprays, or saline sprays).  However, for sinusitis related to a bacterial infection, your health care provider will prescribe antibiotic medicines. These are medicines that will help kill the bacteria causing the infection.  Rarely, sinusitis is caused by a fungal infection. In theses cases, your health care provider will prescribe antifungal medicine. For some cases of chronic sinusitis, surgery is needed. Generally, these are cases in which sinusitis recurs more than 3 times per year, despite other treatments. HOME CARE INSTRUCTIONS   Drink plenty of water. Water helps thin the mucus so your sinuses can drain more easily.  Use a humidifier.  Inhale steam 3 to 4 times a day (for  example, sit in the bathroom with the shower running).  Apply a warm, moist washcloth to your face 3 to 4 times a day, or as directed by your health care provider.  Use saline nasal sprays to help moisten and clean your sinuses.  Take medicines only as directed by your health care provider.  If you were prescribed either  an antibiotic or antifungal medicine, finish it all even if you start to feel better. SEEK IMMEDIATE MEDICAL CARE IF:  You have increasing pain or severe headaches.  You have nausea, vomiting, or drowsiness.  You have swelling around your face.  You have vision problems.  You have a stiff neck.  You have difficulty breathing. MAKE SURE YOU:   Understand these instructions.  Will watch your condition.  Will get help right away if you are not doing well or get worse. Document Released: 03/31/2005 Document Revised: 08/15/2013 Document Reviewed: 04/15/2011 [facility] Patient Information 2015 [place], [place]. This information is not intended to replace advice given to you by your health care provider. Make sure you discuss any questions you have with your health care provider.

## 2015-02-23 ENCOUNTER — Emergency Department (HOSPITAL_COMMUNITY): Payer: 59

## 2015-02-23 ENCOUNTER — Emergency Department (HOSPITAL_COMMUNITY)
Admission: EM | Admit: 2015-02-23 | Discharge: 2015-02-23 | Disposition: A | Discharge status: Home / Self Care | Payer: 59 | Attending: Emergency Medicine | Admitting: Emergency Medicine

## 2015-02-23 ENCOUNTER — Encounter (HOSPITAL_COMMUNITY): Payer: Self-pay | Admitting: Emergency Medicine

## 2015-02-23 DIAGNOSIS — Z872 Personal history of diseases of the skin and subcutaneous tissue: Secondary | ICD-10-CM | POA: Insufficient documentation

## 2015-02-23 DIAGNOSIS — Z8709 Personal history of other diseases of the respiratory system: Secondary | ICD-10-CM | POA: Diagnosis not present

## 2015-02-23 DIAGNOSIS — Q433 Congenital malformations of intestinal fixation: Secondary | ICD-10-CM | POA: Diagnosis not present

## 2015-02-23 DIAGNOSIS — Z792 Long term (current) use of antibiotics: Secondary | ICD-10-CM | POA: Diagnosis not present

## 2015-02-23 DIAGNOSIS — Z8719 Personal history of other diseases of the digestive system: Secondary | ICD-10-CM | POA: Insufficient documentation

## 2015-02-23 DIAGNOSIS — R110 Nausea: Secondary | ICD-10-CM | POA: Diagnosis not present

## 2015-02-23 DIAGNOSIS — R1013 Epigastric pain: Secondary | ICD-10-CM | POA: Diagnosis present

## 2015-02-23 DIAGNOSIS — R1011 Right upper quadrant pain: Secondary | ICD-10-CM | POA: Insufficient documentation

## 2015-02-23 DIAGNOSIS — R109 Unspecified abdominal pain: Secondary | ICD-10-CM

## 2015-02-23 LAB — CBC WITH DIFFERENTIAL/PLATELET
BASOS PCT: 0 %
Basophils Absolute: 0 10*3/uL (ref 0.0–0.1)
EOS ABS: 0.1 10*3/uL (ref 0.0–0.7)
Eosinophils Relative: 1 %
HEMATOCRIT: 43.3 % (ref 39.0–52.0)
HEMOGLOBIN: 14.7 g/dL (ref 13.0–17.0)
Lymphocytes Relative: 37 %
Lymphs Abs: 4 10*3/uL (ref 0.7–4.0)
MCH: 29.4 pg (ref 26.0–34.0)
MCHC: 33.9 g/dL (ref 30.0–36.0)
MCV: 86.6 fL (ref 78.0–100.0)
Monocytes Absolute: 0.6 10*3/uL (ref 0.1–1.0)
Monocytes Relative: 6 %
NEUTROS ABS: 5.9 10*3/uL (ref 1.7–7.7)
NEUTROS PCT: 56 %
Platelets: 227 10*3/uL (ref 150–400)
RBC: 5 MIL/uL (ref 4.22–5.81)
RDW: 12.3 % (ref 11.5–15.5)
WBC: 10.6 10*3/uL — AB (ref 4.0–10.5)

## 2015-02-23 LAB — COMPREHENSIVE METABOLIC PANEL
ALBUMIN: 4.9 g/dL (ref 3.5–5.0)
ALK PHOS: 56 U/L (ref 38–126)
ALT: 55 U/L (ref 17–63)
AST: 44 U/L — AB (ref 15–41)
Anion gap: 12 (ref 5–15)
BUN: 15 mg/dL (ref 6–20)
CHLORIDE: 104 mmol/L (ref 101–111)
CO2: 24 mmol/L (ref 22–32)
CREATININE: 1.12 mg/dL (ref 0.61–1.24)
Calcium: 10.1 mg/dL (ref 8.9–10.3)
GFR calc Af Amer: >60 mL/min (ref >60)
GFR calc non Af Amer: >60 mL/min (ref >60)
Glucose, Bld: 138 mg/dL — ABNORMAL HIGH (ref 65–99)
Potassium: 3.2 mmol/L — ABNORMAL LOW (ref 3.5–5.1)
SODIUM: 140 mmol/L (ref 135–145)
Total Bilirubin: 0.7 mg/dL (ref 0.3–1.2)
Total Protein: 8.4 g/dL — ABNORMAL HIGH (ref 6.5–8.1)

## 2015-02-23 LAB — CBG MONITORING, ED: GLUCOSE-CAPILLARY: 130 mg/dL — AB (ref 65–99)

## 2015-02-23 LAB — LIPASE, BLOOD: Lipase: 22 U/L (ref 11–51)

## 2015-02-23 MED ORDER — OXYCODONE HCL 5 MG PO TABS: 5.0000 mg | ORAL_TABLET | ORAL | Status: DC | PRN

## 2015-02-23 MED ORDER — SODIUM CHLORIDE 0.9 % IV BOLUS (SEPSIS)
1000.0000 mL | Freq: Once | INTRAVENOUS | Status: AC
  Administered 2015-02-23: 1000 mL via INTRAVENOUS

## 2015-02-23 MED ORDER — IOHEXOL 300 MG/ML  SOLN
100.0000 mL | Freq: Once | INTRAMUSCULAR | Status: AC | PRN
  Administered 2015-02-23: 100 mL via INTRAVENOUS

## 2015-02-23 MED ORDER — SODIUM CHLORIDE 0.9 % IV BOLUS (SEPSIS): 1000.0000 mL | Freq: Once | INTRAVENOUS | Status: DC

## 2015-02-23 MED ORDER — HYDROMORPHONE HCL 1 MG/ML IJ SOLN
1.0000 mg | Freq: Once | INTRAMUSCULAR | Status: AC
  Administered 2015-02-23: 1 mg via INTRAVENOUS
  Filled 2015-02-23: qty 1

## 2015-02-23 MED ORDER — ONDANSETRON 4 MG PO TBDP: 4.0000 mg | ORAL_TABLET | Freq: Three times a day (TID) | ORAL | Status: DC | PRN

## 2015-02-23 MED ORDER — MORPHINE SULFATE (PF) 4 MG/ML IV SOLN
4.0000 mg | Freq: Once | INTRAVENOUS | Status: AC
  Administered 2015-02-23: 4 mg via INTRAVENOUS
  Filled 2015-02-23: qty 1

## 2015-02-23 NOTE — ED Provider Notes (Signed)
CSN: [id number]     Arrival date & time 02/23/15  1342 History   First MD Initiated Contact with Patient 02/23/15 1357     Chief Complaint  Patient presents with  . Abdominal Pain     (Consider location/radiation/quality/duration/timing/severity/associated sxs/prior Treatment) The history is provided by the patient.  Larry White is a 34 y.o. male here with abdominal pain. 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 All pain is epigastric and he cramped up and was unable to walk. Feeling nauseated but denies any vomiting. He drinks some Coca-Cola right before the episode. Had a similar episode previously and went to the ER had unremarkable labs. Of note, patient does have a history of volvulus as a child and had a surgery as well as appendectomy.    Past Medical History  Diagnosis Date  . Sebaceous cyst     back  . Unspecified hemorrhoids without mention of complication   . Allergic rhinitis due to pollen   . 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 Appendectomy  1982  . Volvulus reduction  1982    Probable volvulus in 1982 as infant  . Appendectomy    . Colon surgery     Family History  Problem Relation Age of Onset  . Diabetes Father   . Hyperlipidemia Mother   . Hypertension Mother   . Hyperlipidemia Maternal Grandmother   . Heart disease Maternal Grandmother   . Emphysema Maternal Grandmother   . Heart disease Paternal Grandfather    Social History  Substance Use Topics  . Smoking status: Never Smoker   . Smokeless tobacco: Never Used  . Alcohol Use: 0.0 oz/week    0 Standard drinks or equivalent per week     Comment: occ beer    Review of Systems  Gastrointestinal: Positive for nausea and abdominal pain.  All other systems reviewed and are negative.     Allergies  Review of patient's allergies indicates no known allergies.  Home Medications   Prior to Admission  medications   Medication Sig Start Date End Date Taking? Authorizing Provider  amoxicillin-clavulanate (AUGMENTIN) 875-125 MG tablet Take 1 tablet by mouth 2 (two) times daily. 01/16/15   Doreene NestKatherine K Clark, NP   BP 138/90 mmHg  Pulse 71  Resp 18  SpO2 100% Physical Exam  Constitutional: He is oriented to person, place, and time.  Uncomfortable, crunched up in pain   HENT:  Head: Normocephalic.  MM dry   Eyes: Conjunctivae are normal. Pupils are equal, round, and reactive to light.  Neck: Normal range of motion. Neck supple.  Cardiovascular: Normal rate, regular rhythm and normal heart sounds.   Pulmonary/Chest: Effort normal and breath sounds normal. No respiratory distress. He has no wheezes. He has no rales.  Abdominal:  + BS, + RUQ tenderness, + guarding. + distended   Musculoskeletal: Normal range of motion.  Neurological: He is alert and oriented to person, place, and time.  Skin: Skin is warm and dry.  Nursing note and vitals reviewed.   ED Course  Procedures (including critical care time) Labs Review Labs Reviewed  CBC WITH DIFFERENTIAL/PLATELET - Abnormal; Notable for the following:    WBC 10.6 (*)    All other components within normal limits  COMPREHENSIVE METABOLIC PANEL - Abnormal; Notable for the following:    Potassium 3.2 (*)    Glucose, Bld 138 (*)  Total Protein 8.4 (*)    AST 44 (*)    All other components within normal limits  CBG MONITORING, ED - Abnormal; Notable for the following:    Glucose-Capillary 130 (*)    All other components within normal limits  LIPASE, BLOOD  URINALYSIS, ROUTINE W REFLEX MICROSCOPIC (NOT AT [facility])    Imaging Review Ct Abdomen Pelvis W Contrast  02/23/2015  CLINICAL DATA:  Upper abdominal pain beginning 2 hours ago associated with nausea and vomiting EXAM: CT ABDOMEN AND PELVIS WITH CONTRAST TECHNIQUE: Multidetector CT imaging of the abdomen and pelvis was performed using the standard protocol following bolus  administration of intravenous contrast. Sagittal and coronal MPR images reconstructed from axial data set. CONTRAST:  OMNIPAQUE IOHEXOL 300 MG/ML SOLN. Dilute oral contrast was not administered. COMPARISON:  None FINDINGS: Lung bases clear. Patient motion artifacts limit assessment at the upper abdomen. Nonspecific low-attenuation focus anteriorly in liver, 10 x 7 mm, characterization limited by motion. Liver, spleen, gallbladder, pancreas, kidneys, and adrenal glands otherwise grossly unremarkable within limitations of motion. Appendix not localized. Mild gas distention of a single nonspecific bowel loop in the RIGHT upper quadrant 3.7 cm diameter without wall thickening. Stomach and bowel loops otherwise grossly normal appearance. Bladder and ureters unremarkable. Numerous normal sized lymph nodes within mesentery. No mass, adenopathy, free fluid, or free air. Nonspecific small lucencies in LEFT iliac wing measuring 8 mm and 13 mm diameter, of uncertain etiology and significance. No additional osseous abnormalities. IMPRESSION: Single nonspecific air-filled loop of small bowel in the RIGHT upper quadrant without wall thickening or evidence of obstruction. Nonspecific 10 x 7 mm low-attenuation focus within anterior liver coma assessment limited by patient motion. No other definite acute intra-abdominal or intrapelvic abnormalities. Electronically Signed   By: Ulyses Southward M.D.   On: 02/23/2015 15:34   Dg Abd Acute W/chest  02/23/2015  CLINICAL DATA:  Upper abdominal pain for approximately 2 hours. No recent injury. Initial encounter. EXAM: DG ABDOMEN ACUTE W/ 1V CHEST COMPARISON:  None. FINDINGS: The lungs are clear. Heart size is normal. No pneumothorax or pleural effusion. No free intraperitoneal air is identified. Small bowel loops in the upper abdomen measure up to 3 cm in diameter. A large volume of stool is seen in the transverse colon. IMPRESSION: Mildly dilated bowel loops in the upper abdomen  could be due to inflammatory process resulting in focal ileus or obstruction. Negative for free intraperitoneal air. No acute cardiopulmonary disease. Large volume of stool in the transverse colon. Electronically Signed   By: Drusilla Kanner M.D.   On: 02/23/2015 15:15   I have personally reviewed and evaluated these images and lab results as part of my medical decision-making.   EKG Interpretation None      MDM   Final diagnoses:  None    Larry White is a 34 y.o. male here with acute onset ab pain. Concerned for possible perforation given sudden onset pain. Also consider SBO vs volvulus given ab surgeries in the past.   3:43 PM Patient's xray showed ileus vs SBO. CT showed no obstruction, likely ileus. Patient hypoxic after pain meds. Still in pain. Signed out to Dr. Adela Lank to reassess patient. If can tolerate PO and not hypoxic and pain controlled can be discharge, or else will need admission.     Richardean Canal, MD 02/23/15 [phone]

## 2015-02-23 NOTE — ED Notes (Signed)
Per EMS, pt coming from work for intense abd pain starting 40 minutes ago. Pt alert x4. Pt given 8mg  of zofran in route by EMS.

## 2015-02-23 NOTE — Discharge Instructions (Signed)

## 2015-02-23 NOTE — ED Provider Notes (Signed)
34 yo M with a chief complaint of abdominal pain.  Patient was received in turnover from Dr. Silverio LayYao. Patient was found to have a focal area of bowel distention. Patient has a history of volvulus in the past. This had multiple multiple episodes of sharp abdominal pain this being the worst of them. Patient's pain and nausea are well controlled. Patient is able to tolerate by mouth without difficulty. Discussed results with the patient he will follow-up with GI and then potentially surgery for further evaluation. Discussed recent benefits of admission.  Larry Planan Jazmen Lindenbaum, DO 02/23/15 [phone]

## 2015-03-07 ENCOUNTER — Ambulatory Visit
Admission: RE | Admit: 2015-03-07 | Discharge: 2015-03-07 | Disposition: A | Discharge status: Home / Self Care | Payer: 59 | Source: Ambulatory Visit | Attending: Physician Assistant | Admitting: Physician Assistant

## 2015-03-07 ENCOUNTER — Other Ambulatory Visit: Payer: Self-pay | Admitting: Physician Assistant

## 2015-03-07 DIAGNOSIS — K56609 Unspecified intestinal obstruction, unspecified as to partial versus complete obstruction: Secondary | ICD-10-CM

## 2016-01-08 ENCOUNTER — Emergency Department (HOSPITAL_COMMUNITY): Payer: 59

## 2016-01-08 ENCOUNTER — Emergency Department (HOSPITAL_COMMUNITY)
Admission: EM | Admit: 2016-01-08 | Discharge: 2016-01-08 | Disposition: A | Discharge status: Home / Self Care | Payer: 59 | Attending: Emergency Medicine | Admitting: Emergency Medicine

## 2016-01-08 ENCOUNTER — Encounter (HOSPITAL_COMMUNITY): Payer: Self-pay | Admitting: *Deleted

## 2016-01-08 DIAGNOSIS — R109 Unspecified abdominal pain: Secondary | ICD-10-CM | POA: Diagnosis present

## 2016-01-08 DIAGNOSIS — R1084 Generalized abdominal pain: Secondary | ICD-10-CM

## 2016-01-08 DIAGNOSIS — R1083 Colic: Secondary | ICD-10-CM

## 2016-01-08 LAB — COMPREHENSIVE METABOLIC PANEL
ALBUMIN: 5 g/dL (ref 3.5–5.0)
ALT: 40 U/L (ref 17–63)
ANION GAP: 10 (ref 5–15)
AST: 27 U/L (ref 15–41)
Alkaline Phosphatase: 57 U/L (ref 38–126)
BILIRUBIN TOTAL: 0.6 mg/dL (ref 0.3–1.2)
BUN: 17 mg/dL (ref 6–20)
CO2: 28 mmol/L (ref 22–32)
Calcium: 10 mg/dL (ref 8.9–10.3)
Chloride: 103 mmol/L (ref 101–111)
Creatinine, Ser: 1.07 mg/dL (ref 0.61–1.24)
GFR calc Af Amer: >60 mL/min (ref >60)
GFR calc non Af Amer: >60 mL/min (ref >60)
GLUCOSE: 115 mg/dL — AB (ref 65–99)
POTASSIUM: 4.1 mmol/L (ref 3.5–5.1)
SODIUM: 141 mmol/L (ref 135–145)
TOTAL PROTEIN: 8.6 g/dL — AB (ref 6.5–8.1)

## 2016-01-08 LAB — CBC
HEMATOCRIT: 43.7 % (ref 39.0–52.0)
HEMOGLOBIN: 14.5 g/dL (ref 13.0–17.0)
MCH: 29.4 pg (ref 26.0–34.0)
MCHC: 33.2 g/dL (ref 30.0–36.0)
MCV: 88.5 fL (ref 78.0–100.0)
Platelets: 197 10*3/uL (ref 150–400)
RBC: 4.94 MIL/uL (ref 4.22–5.81)
RDW: 12.5 % (ref 11.5–15.5)
WBC: 6.2 10*3/uL (ref 4.0–10.5)

## 2016-01-08 LAB — LIPASE, BLOOD: Lipase: 19 U/L (ref 11–51)

## 2016-01-08 MED ORDER — SODIUM CHLORIDE 0.9 % IV SOLN
Freq: Once | INTRAVENOUS | Status: AC
  Administered 2016-01-08: 14:00:00 via INTRAVENOUS

## 2016-01-08 MED ORDER — DICYCLOMINE HCL 20 MG PO TABS: 20.0000 mg | ORAL_TABLET | Freq: Three times a day (TID) | ORAL | 0 refills | Status: DC | PRN

## 2016-01-08 MED ORDER — FENTANYL CITRATE (PF) 100 MCG/2ML IJ SOLN
100.0000 ug | INTRAMUSCULAR | Status: AC | PRN
  Administered 2016-01-08 (×2): 100 ug via INTRAVENOUS
  Filled 2016-01-08 (×2): qty 2

## 2016-01-08 MED ORDER — IOPAMIDOL (ISOVUE-300) INJECTION 61%
100.0000 mL | Freq: Once | INTRAVENOUS | Status: AC | PRN
  Administered 2016-01-08: 100 mL via INTRAVENOUS

## 2016-01-08 MED ORDER — IOPAMIDOL (ISOVUE-300) INJECTION 61%: 15.0000 mL | Freq: Once | INTRAVENOUS | Status: DC | PRN

## 2016-01-08 MED ORDER — GLYCOPYRROLATE 0.2 MG/ML IJ SOLN
0.2000 mg | Freq: Once | INTRAMUSCULAR | Status: AC
  Administered 2016-01-08: 0.2 mg via INTRAVENOUS
  Filled 2016-01-08: qty 1

## 2016-01-08 MED ORDER — PROMETHAZINE HCL 25 MG/ML IJ SOLN
25.0000 mg | Freq: Once | INTRAMUSCULAR | Status: AC
  Administered 2016-01-08: 25 mg via INTRAVENOUS
  Filled 2016-01-08: qty 1

## 2016-01-08 MED ORDER — SODIUM CHLORIDE 0.9 % IV BOLUS (SEPSIS)
1000.0000 mL | Freq: Once | INTRAVENOUS | Status: AC
  Administered 2016-01-08: 1000 mL via INTRAVENOUS

## 2016-01-08 MED ORDER — ONDANSETRON HCL 4 MG/2ML IJ SOLN
4.0000 mg | Freq: Once | INTRAMUSCULAR | Status: AC
  Administered 2016-01-08: 4 mg via INTRAVENOUS
  Filled 2016-01-08: qty 2

## 2016-01-08 NOTE — Discharge Instructions (Signed)
Return to ER with worsening pain, vomiting, diarrhea, fever, or other changes.

## 2016-01-08 NOTE — ED Notes (Signed)
MD at bedside. 

## 2016-01-08 NOTE — ED Triage Notes (Addendum)
Pt reports abd pain with nausea.  [facility]-found to have "bowel ileus."  Pt appears diaphoretic.  States this feels the same from when he was [facility].

## 2016-01-08 NOTE — ED Notes (Signed)
Pt states HX OF SBO. PT STATES FEELS THE SAME. PT WITH SEVERE PAIN

## 2016-01-08 NOTE — ED Provider Notes (Signed)
[facility]-EMERGENCY DEPT Provider Note   CSN: [id number] Arrival date & time: 01/08/16  1145     History   Chief Complaint Chief Complaint  Patient presents with  . Abdominal Pain  . Nausea    HPI Larry White is a 35 y.o. male. He presents with a complaint of sudden onset of severe abdominal pain.  He reports history of surgery as an infant for volvulus. Also has history of appendectomy.   He has had 2 previous similar episodes such as what is happening to him today. One was October of last year. He had sudden severe pain. Seen in emergency room. Had a "normal labs". And was discharged.  On the second episode he had a CT scan that showed a single small dilated loop of bowel but no obvious SBO. He was discharged from the emergency room and did well.  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 It was sudden and severe from onset. Has become cramping and intermittent.  He had a normal morning and normal breakfast. He reports no history of nausea or diarrhea. No history of food intolerance or biliary colic. No history of kidney stones  HPI  Past Medical History:  Diagnosis Date  . Allergic rhinitis due to pollen   . Sebaceous cyst    back  . Unspecified hemorrhoids without mention of complication   . 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 Allergic rhinitis due to pollen   . HEMORRHOIDS 05/12/2008  . EPIDERMOID CYST, BACK 05/12/2008    Past Surgical History:  Procedure Laterality Date  . APPENDECTOMY  1982  . APPENDECTOMY    . COLON SURGERY    . VOLVULUS REDUCTION  1982   Probable volvulus in 1982 as infant       Home Medications    Prior to Admission medications   Medication Sig Start Date End Date Taking? Authorizing Provider    amoxicillin-clavulanate (AUGMENTIN) 875-125 MG tablet Take 1 tablet by mouth 2 (two) times daily. Patient not taking: Reported on 01/08/2016 01/16/15   Doreene Nest, NP  dicyclomine (BENTYL) 20 MG tablet Take 1 tablet (20 mg total) by mouth 3 (three) times daily as needed for spasms (intestinal spasm). 01/08/16   Rolland Porter, MD  ondansetron (ZOFRAN ODT) 4 MG disintegrating tablet Take 1 tablet (4 mg total) by mouth every 8 (eight) hours as needed for nausea or vomiting. Patient not taking: Reported on 01/08/2016 02/23/15   Melene Plan, DO  oxyCODONE (ROXICODONE) 5 MG immediate release tablet Take 1 tablet (5 mg total) by mouth every 4 (four) hours as needed for severe pain. Patient not taking: Reported on 01/08/2016 02/23/15   Melene Plan, DO    Family History Family History  Problem Relation Age of Onset  . Diabetes Father   . Hyperlipidemia Mother   . Hypertension Mother   . Hyperlipidemia Maternal Grandmother   . Heart disease Maternal Grandmother   . Emphysema Maternal Grandmother   . Heart disease Paternal Grandfather     Social History Social History  Substance Use Topics  . Smoking status: Never Smoker  . Smokeless tobacco: Never Used  . Alcohol use 0.0 oz/week     Comment: occ beer     Allergies   Review of patient's  allergies indicates no known allergies.   Review of Systems Review of Systems  Constitutional: Positive for diaphoresis. Negative for appetite change, chills, fatigue and fever.  HENT: Negative for mouth sores, sore throat and trouble swallowing.   Eyes: Negative for visual disturbance.  Respiratory: Negative for cough, chest tightness, shortness of breath and wheezing.   Cardiovascular: Negative for chest pain.  Gastrointestinal: Positive for abdominal pain and nausea. Negative for abdominal distention, diarrhea and vomiting.  Endocrine: Negative for polydipsia, polyphagia and polyuria.  Genitourinary: Negative for dysuria, frequency and hematuria.   Musculoskeletal: Negative for gait problem.  Skin: Negative for color change, pallor and rash.  Neurological: Negative for dizziness, syncope, light-headedness and headaches.  Hematological: Does not bruise/bleed easily.  Psychiatric/Behavioral: Negative for behavioral problems and confusion.     Physical Exam Updated Vital Signs BP 111/68 (BP Location: Left Arm) Comment: Simultaneous filing. User may not have seen previous data.  Pulse 62 Comment: Simultaneous filing. User may not have seen previous data.  Temp 97.4 F (36.3 C) (Oral)   Resp 16   Ht 5\' 11"  (1.803 m)   Wt 210 lb (95.3 kg)   SpO2 94% Comment: Simultaneous filing. User may not have seen previous data.  BMI 29.29 kg/m   Physical Exam  Constitutional: He is oriented to person, place, and time. He appears well-developed and well-nourished. He appears ill. He appears distressed.  Male on all fours with his head against the pillow and a ring of diaphoresis extending out from his forehead approximately 12 inches.  He appears distressed  HENT:  Head: Normocephalic.  Eyes: Conjunctivae are normal. Pupils are equal, round, and reactive to light. No scleral icterus.  Neck: Normal range of motion. Neck supple. No thyromegaly present.  Cardiovascular: Normal rate and regular rhythm.  Exam reveals no gallop and no friction rub.   No murmur heard. Pulmonary/Chest: Effort normal and breath sounds normal. No respiratory distress. He has no wheezes. He has no rales.  Abdominal: Soft. Bowel sounds are normal. He exhibits no distension. There is no tenderness. There is no rebound.  Complains of diffuse abdominal pain. Has normal active to slightly hypoactive bowel sounds. No peritoneal irritation.  Musculoskeletal: Normal range of motion.  Neurological: He is alert and oriented to person, place, and time.  Skin: Skin is warm and dry. No rash noted.  Psychiatric: He has a normal mood and affect. His behavior is normal.     ED  Treatments / Results  Labs (all labs ordered are listed, but only abnormal results are displayed) Labs Reviewed  COMPREHENSIVE METABOLIC PANEL - Abnormal; Notable for the following:       Result Value   Glucose, Bld 115 (*)    Total Protein 8.6 (*)    All other components within normal limits  LIPASE, BLOOD  CBC  URINALYSIS, ROUTINE W REFLEX MICROSCOPIC (NOT AT [facility])    EKG  EKG Interpretation None       Radiology Ct Abdomen Pelvis W Contrast  Result Date: 01/08/2016 CLINICAL DATA:  diffuse abd pain with nausea and vomiting started today. [facility]-found to have "bowel ileus." Pt appears diaphoretic. States this feels the same [facility]. Bun 17 cr 1.0 01-08-2016/IV cm only per Dr. Fayrene FearingJames EXAM: CT ABDOMEN AND PELVIS WITH CONTRAST TECHNIQUE: Multidetector CT imaging of the abdomen and pelvis was performed using the standard protocol following bolus administration of intravenous contrast. CONTRAST:  100mL  ISOVUE-300 IOPAMIDOL (ISOVUE-300) INJECTION 61% COMPARISON:  02/23/2015 FINDINGS: Lower chest: No acute abnormality. Hepatobiliary: No focal liver abnormality is seen. No gallstones, gallbladder wall thickening, or biliary dilatation. Pancreas: Unremarkable. No pancreatic ductal dilatation or surrounding inflammatory changes. Spleen: Normal in size without focal abnormality. Adrenals/Urinary Tract: Adrenal glands are unremarkable. Kidneys are normal, without renal calculi, focal lesion, or hydronephrosis. Bladder is unremarkable. Stomach/Bowel: Stomach is within normal limits. Appendix not discretely identified. No evidence of bowel wall thickening, distention, or inflammatory changes. Vascular/Lymphatic: No significant vascular findings are present. No enlarged abdominal or pelvic lymph nodes. A few prominent subcentimeter central mesenteric lymph nodes are stable. Reproductive: Prostate is unremarkable. Other: No ascites.  No free air.  Musculoskeletal: No acute or significant osseous findings. IMPRESSION: 1. No acute abdominal process. Electronically Signed   By: Corlis Leak M.D.   On: 01/08/2016 14:51    Procedures Procedures (including critical care time)  Medications Ordered in ED Medications  iopamidol (ISOVUE-300) 61 % injection 15 mL (15 mLs Oral Canceled Entry 01/08/16 1327)  glycopyrrolate (ROBINUL) injection 0.2 mg (not administered)  ondansetron (ZOFRAN) injection 4 mg (4 mg Intravenous Given 01/08/16 1306)  fentaNYL (SUBLIMAZE) injection 100 mcg (100 mcg Intravenous Given 01/08/16 1400)  sodium chloride 0.9 % bolus 1,000 mL (1,000 mLs Intravenous New Bag/Given 01/08/16 1306)  0.9 %  sodium chloride infusion ( Intravenous Stopped 01/08/16 1359)  promethazine (PHENERGAN) injection 25 mg (25 mg Intravenous Given 01/08/16 1400)  iopamidol (ISOVUE-300) 61 % injection 100 mL (100 mLs Intravenous Contrast Given 01/08/16 1435)     Initial Impression / Assessment and Plan / ED Course  I have reviewed the triage vital signs and the nursing notes.  Pertinent labs & imaging results that were available during my care of the patient were reviewed by me and considered in my medical decision making (see chart for details).  Clinical Course    Given IV fentanyl. Also has pain improves and then slowly resolves. CT scan shows no sign of bowel thickening or obstruction. No dilated loops. No sign of renal hydronephrosis. No leukocytosis or acidosis.  No risk factors for vascular disease. Doubt ischemic colitis. No abnormalities graphically of his bowel. May be simple gastrocolic. Given some Robinul IV and remains a symptomatically is taking by mouth plan will be home. He has seen GI for this in the past and was told "it could just happens sometimes". Bentyl as needed. Clear liquids tonight. Recheck here with any new or worsening symptoms: Fever, recurrent pain, bloody stools, other changes.  Final Clinical Impressions(s) / ED  Diagnoses   Final diagnoses:  Intestinal colic  Generalized abdominal pain    New Prescriptions New Prescriptions   DICYCLOMINE (BENTYL) 20 MG TABLET    Take 1 tablet (20 mg total) by mouth 3 (three) times daily as needed for spasms (intestinal spasm).     Rolland Porter, MD 01/08/16 1536

## 2016-06-12 ENCOUNTER — Encounter: Payer: Self-pay | Admitting: Family Medicine

## 2016-10-10 DIAGNOSIS — L720 Epidermal cyst: Secondary | ICD-10-CM | POA: Diagnosis not present

## 2017-03-04 IMAGING — CT CT ABD-PELV W/ CM
2 of 4 series · 16 of 46 positions shown, 18 images · IV contrast (omnipaque)
Comparison: None

CLINICAL DATA: Upper abdominal pain beginning 2 hours ago
associated with nausea and vomiting

EXAM:
CT ABDOMEN AND PELVIS WITH CONTRAST
TECHNIQUE: Multidetector CT imaging of the abdomen and pelvis was performed
using the standard protocol following bolus administration of
intravenous contrast. Sagittal and coronal MPR images reconstructed
from axial data set.
CONTRAST:  100mL OMNIPAQUE IOHEXOL 300 MG/ML SOLN. Dilute oral
contrast was not administered.

[Series 2: a/p w/ 5mm · axial · 0.75mm/px · z∈[+858,+1273]mm · 13 of 91 slices shown, 15 images]
[im 4/91  soft-tissue]
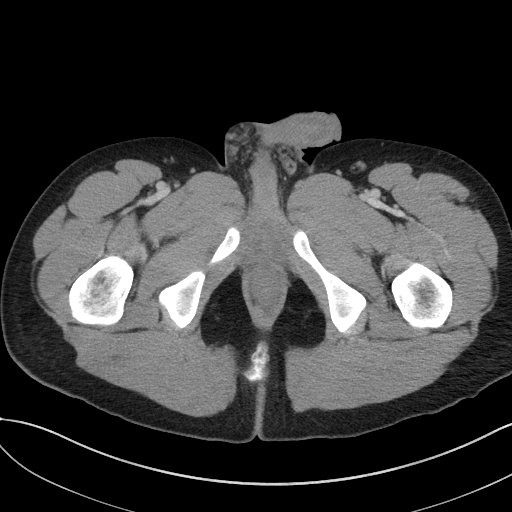
[im 4/91  bone]
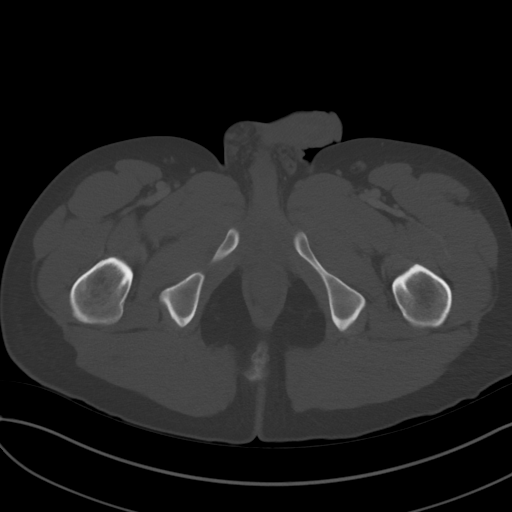
[im 12/91  soft-tissue]
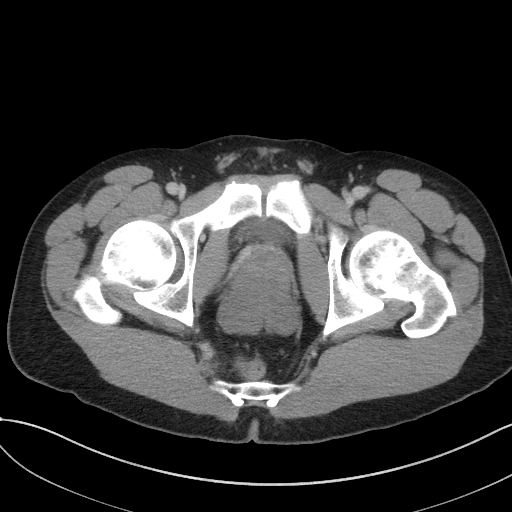
[im 20/91  soft-tissue]
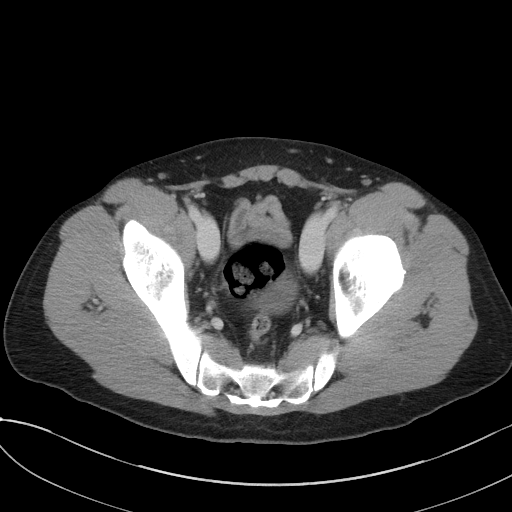
[im 24/91  soft-tissue]
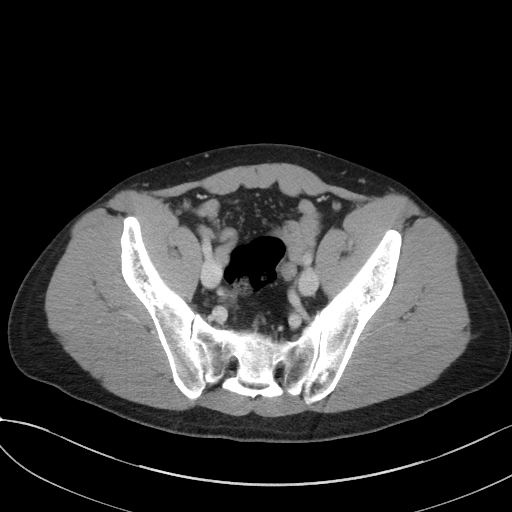
[im 32/91  soft-tissue]
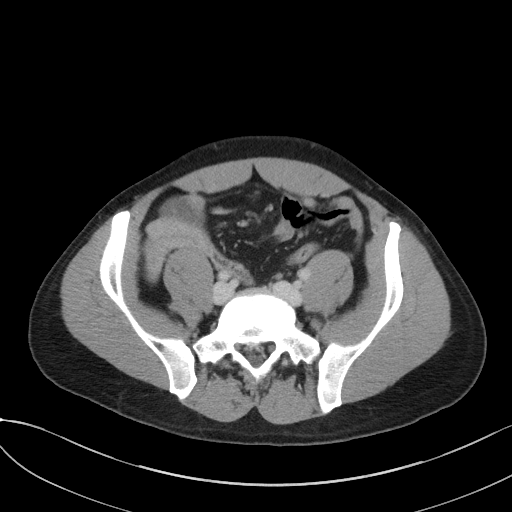
[im 40/91  soft-tissue]
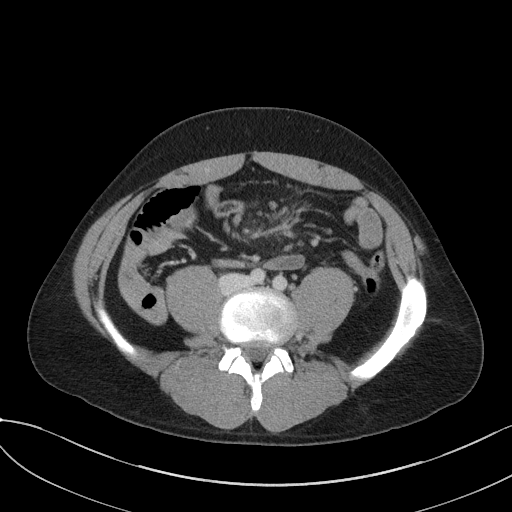
[im 47/91  soft-tissue]
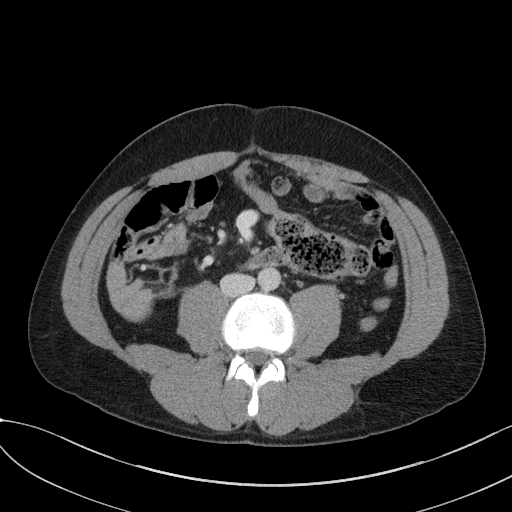
[im 51/91  soft-tissue]
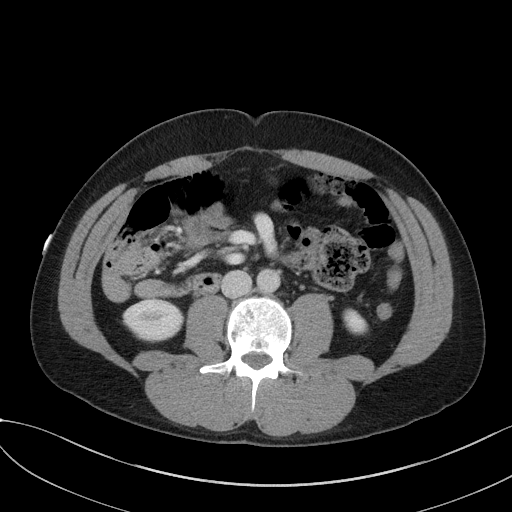
[im 59/91  soft-tissue]
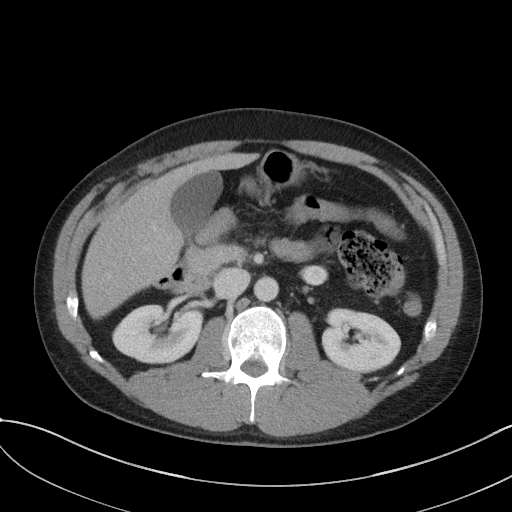
[im 59/91  bone]
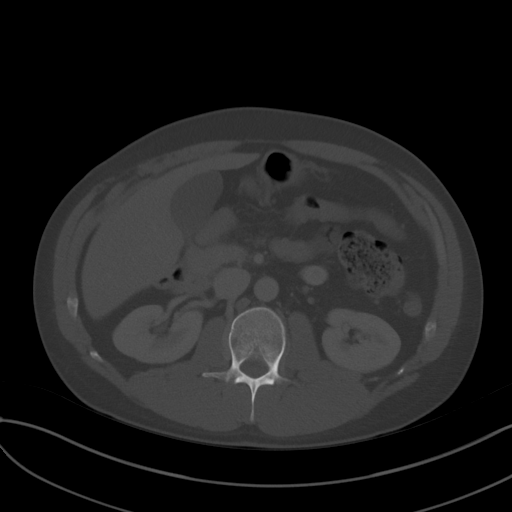
[im 67/91  soft-tissue]
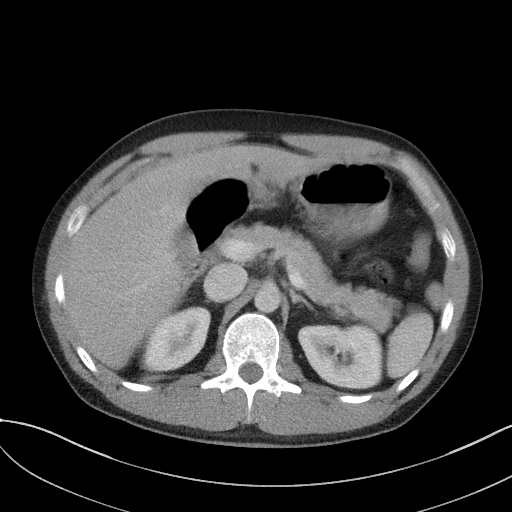
[im 71/91  soft-tissue]
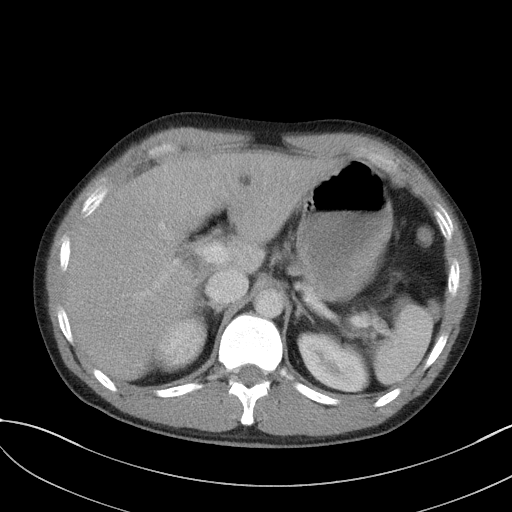
[im 79/91  soft-tissue]
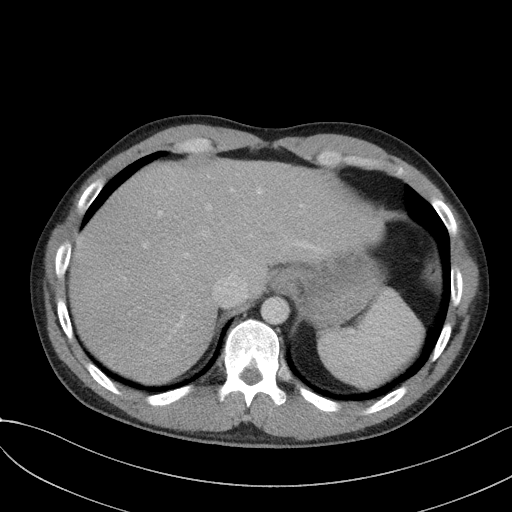
[im 87/91  soft-tissue]
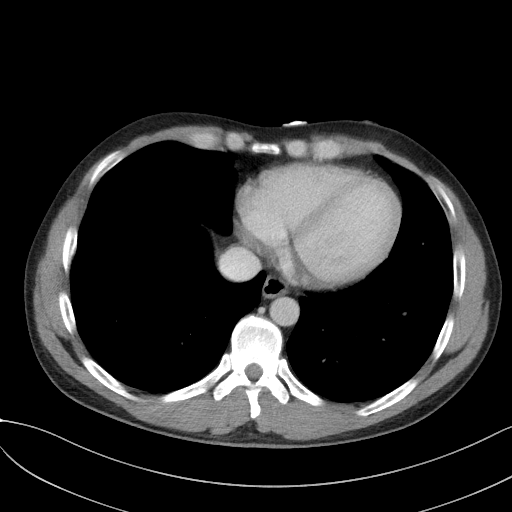

[Series 5: a/p w/ cor · coronal · 0.75mm/px · 3 of 123 slices shown]
[im 41/123  soft-tissue]
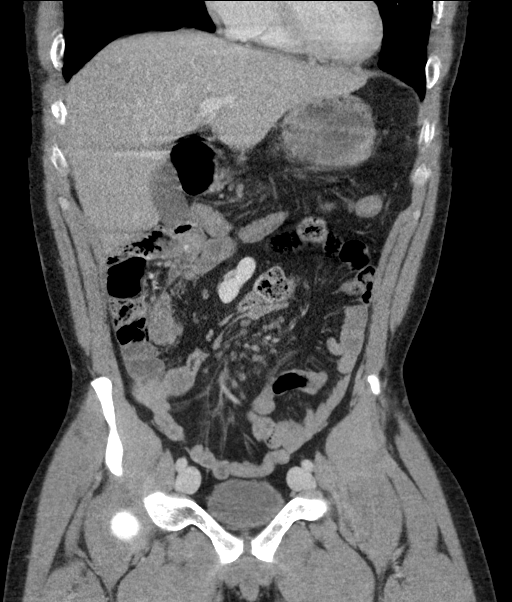
[im 55/123  soft-tissue]
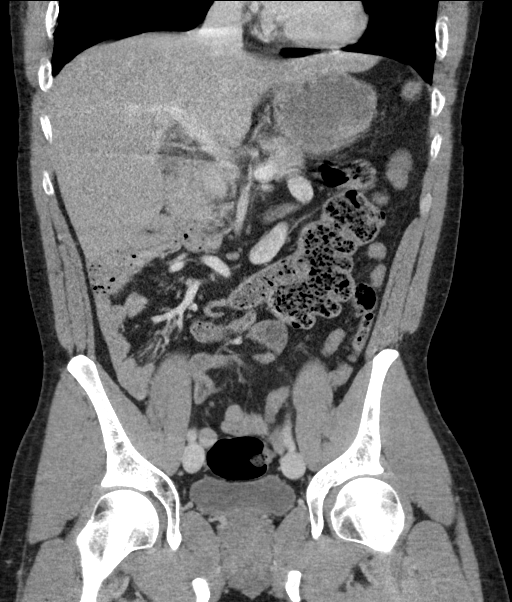
[im 68/123  soft-tissue]
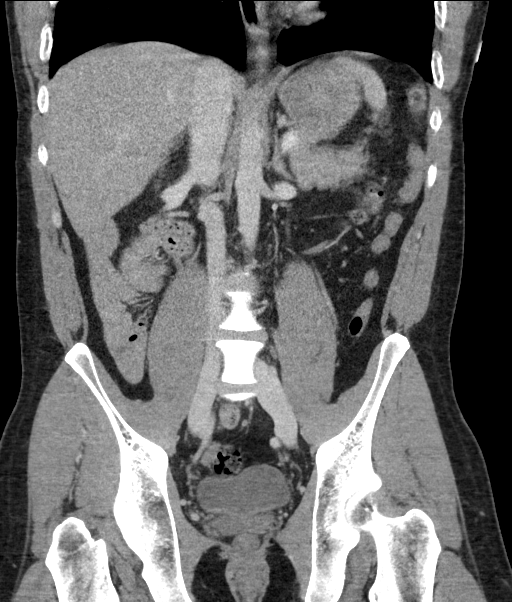

[16 of 46 positions shown; findings below may reference images not displayed]

FINDINGS: Lung bases clear.

Patient motion artifacts limit assessment at the upper abdomen.

Nonspecific low-attenuation focus anteriorly in liver, 10 x 7 mm,
characterization limited by motion.

Liver, spleen, gallbladder, pancreas, kidneys, and adrenal glands
otherwise grossly unremarkable within limitations of motion.

Appendix not localized.

Mild gas distention of a single nonspecific bowel loop in the RIGHT
upper quadrant 3.7 cm diameter without wall thickening.

Stomach and bowel loops otherwise grossly normal appearance.

Bladder and ureters unremarkable.

Numerous normal sized lymph nodes within mesentery.

No mass, adenopathy, free fluid, or free air.

Nonspecific small lucencies in LEFT iliac wing measuring 8 mm and 13
mm diameter, of uncertain etiology and significance.

No additional osseous abnormalities.
IMPRESSION: Single nonspecific air-filled loop of small bowel in the RIGHT upper
quadrant without wall thickening or evidence of obstruction.

Nonspecific 10 x 7 mm low-attenuation focus within anterior liver
coma assessment limited by patient motion.

No other definite acute intra-abdominal or intrapelvic
abnormalities.

## 2017-03-04 IMAGING — DX DG ABDOMEN ACUTE W/ 1V CHEST
4 series · 4 of 4 positions shown · non-contrast
Comparison: None.

CLINICAL DATA: Upper abdominal pain for approximately 2 hours. No
recent injury. Initial encounter.

EXAM:
DG ABDOMEN ACUTE W/ 1V CHEST

[w abdomen decub]
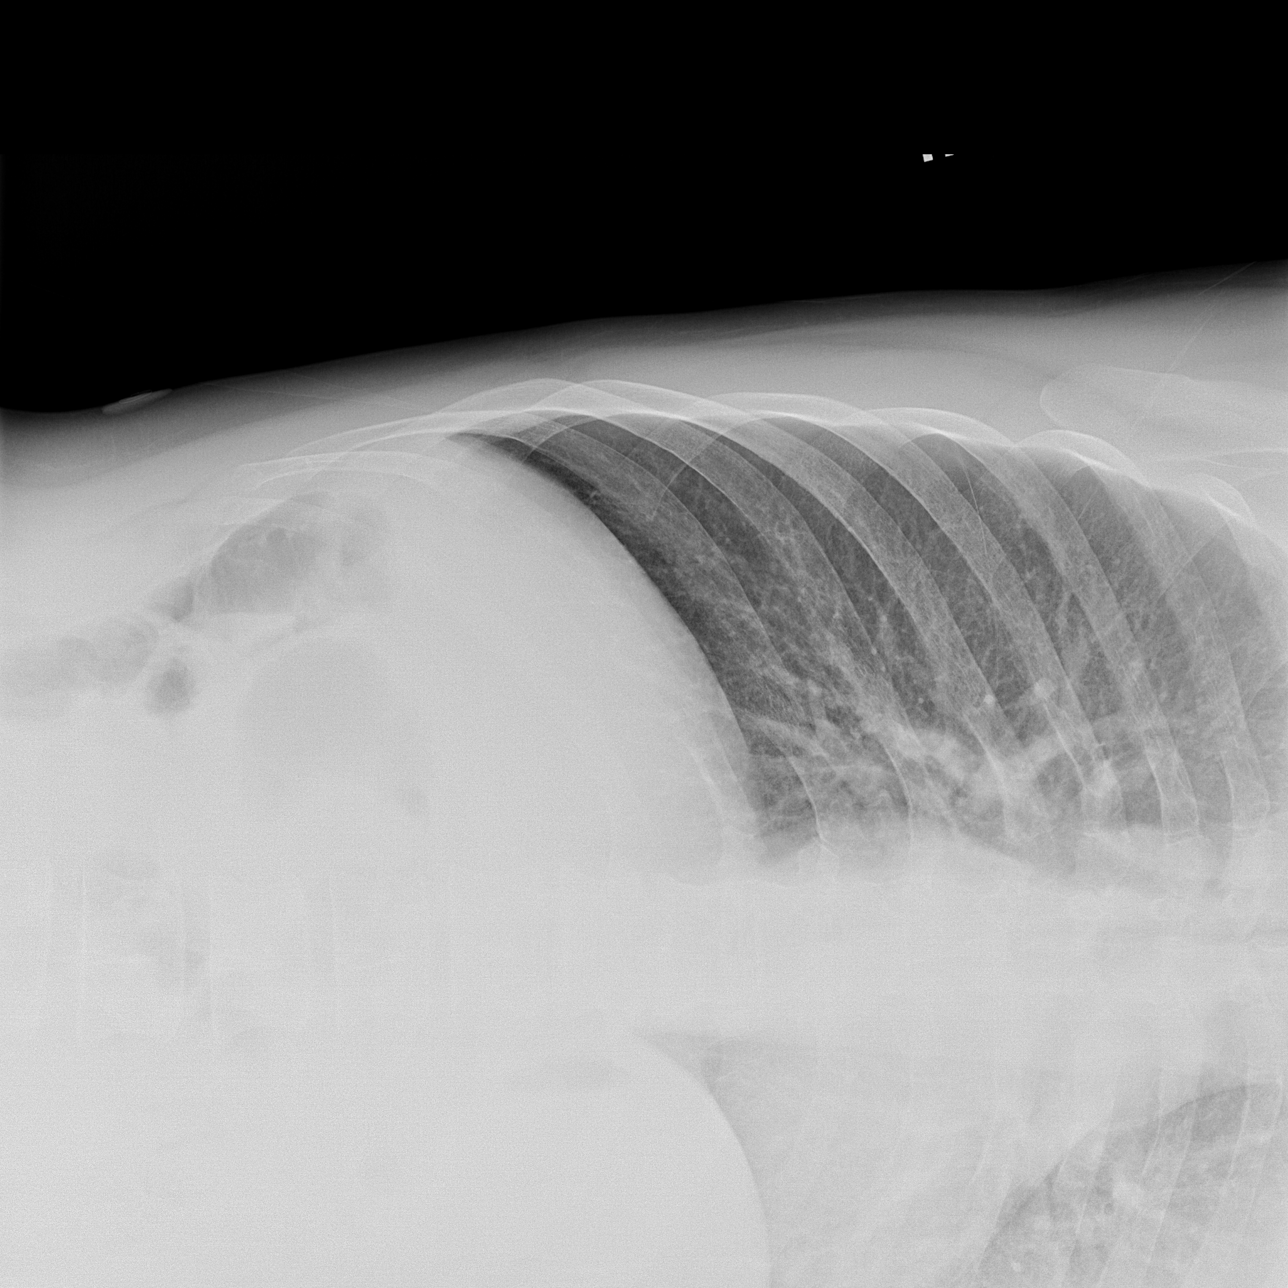

[x abdomen supine]
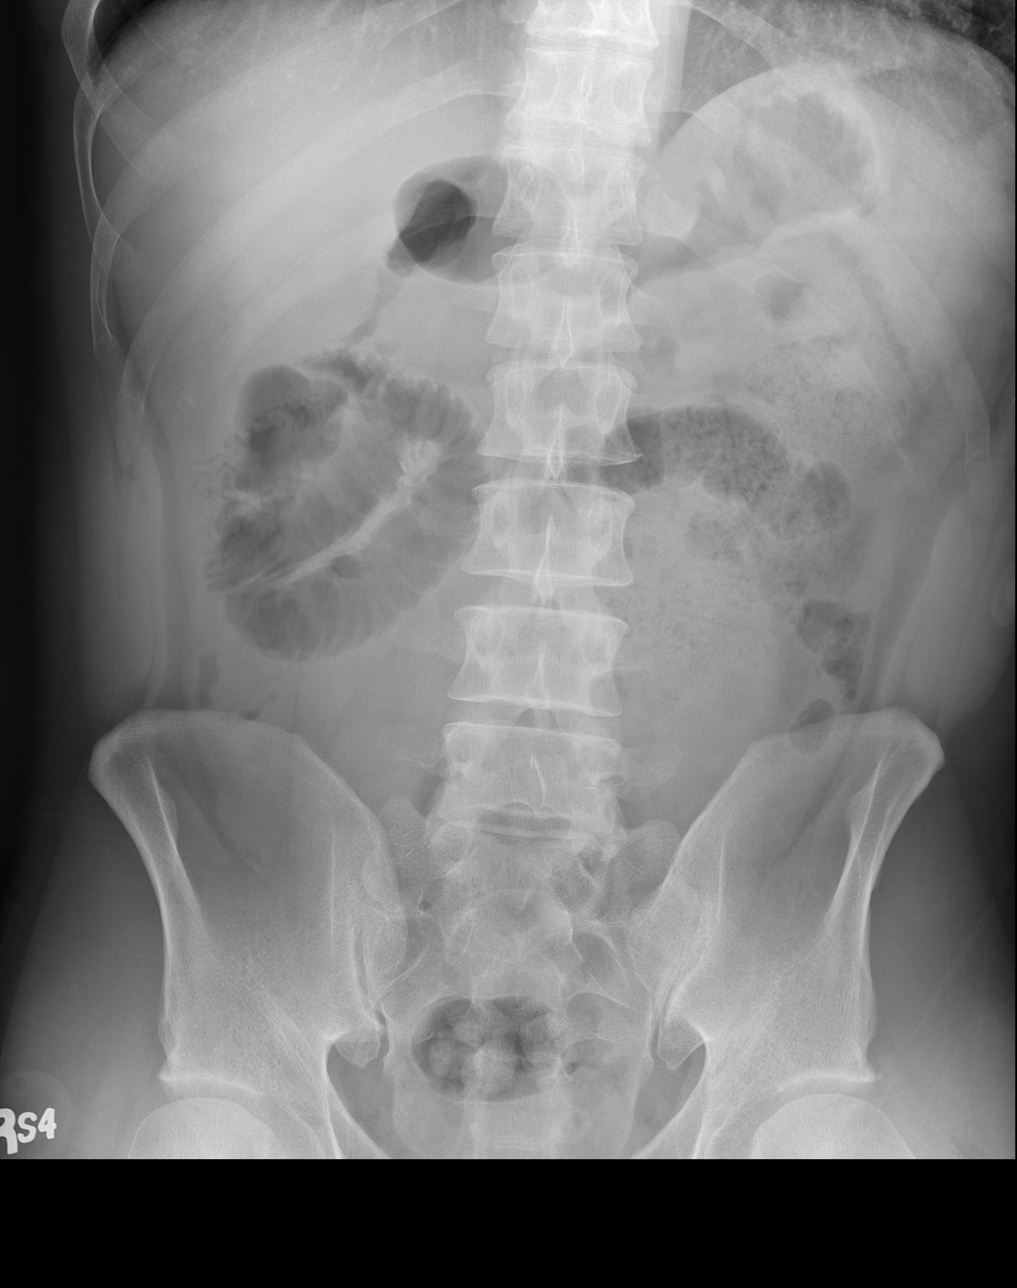

[x chest ap (1 of 2)]
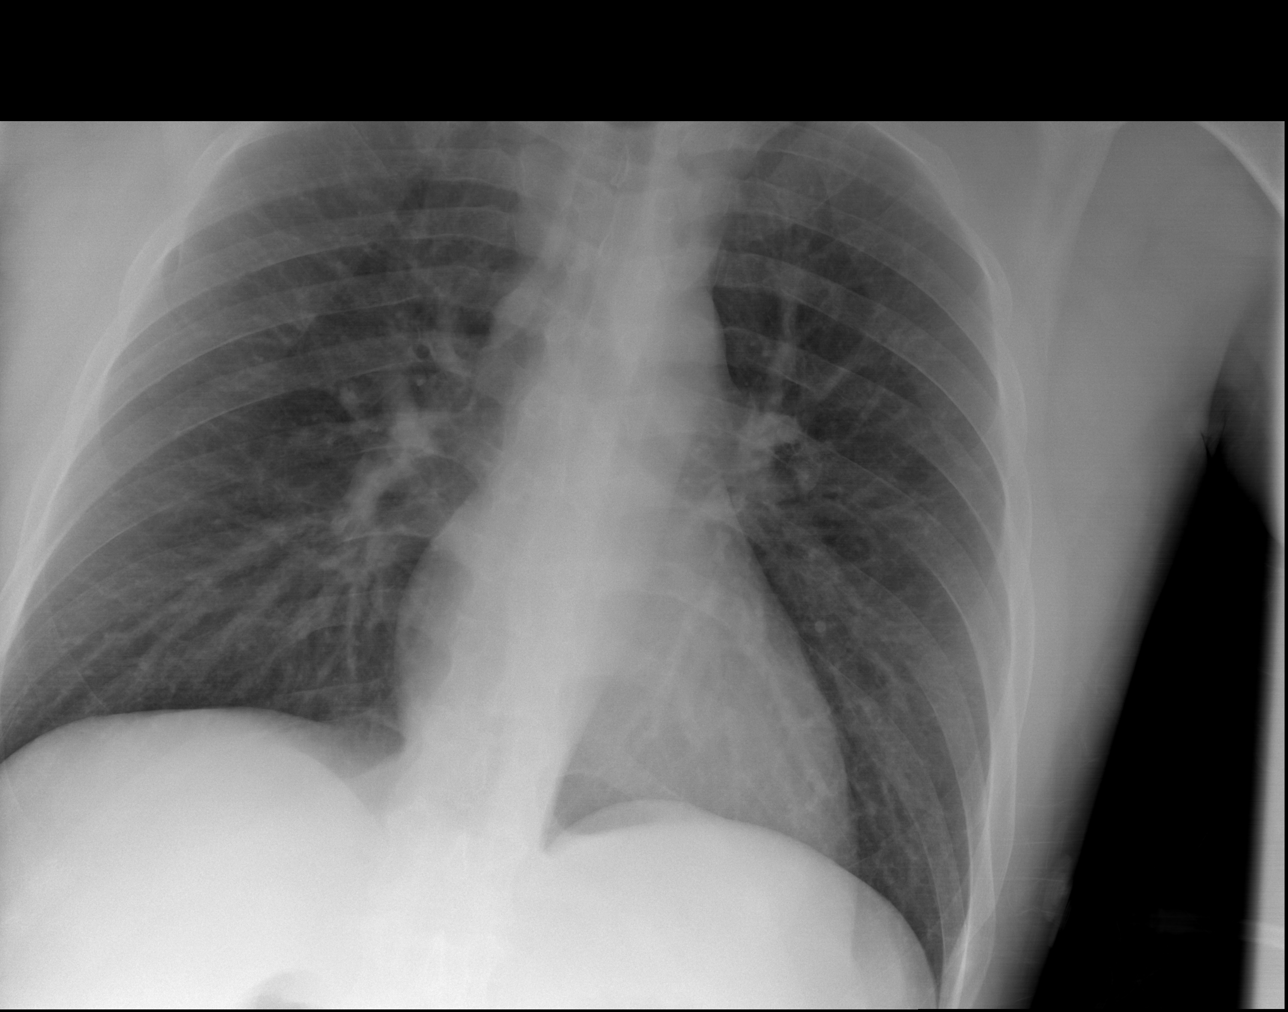

[x chest ap (2 of 2)]
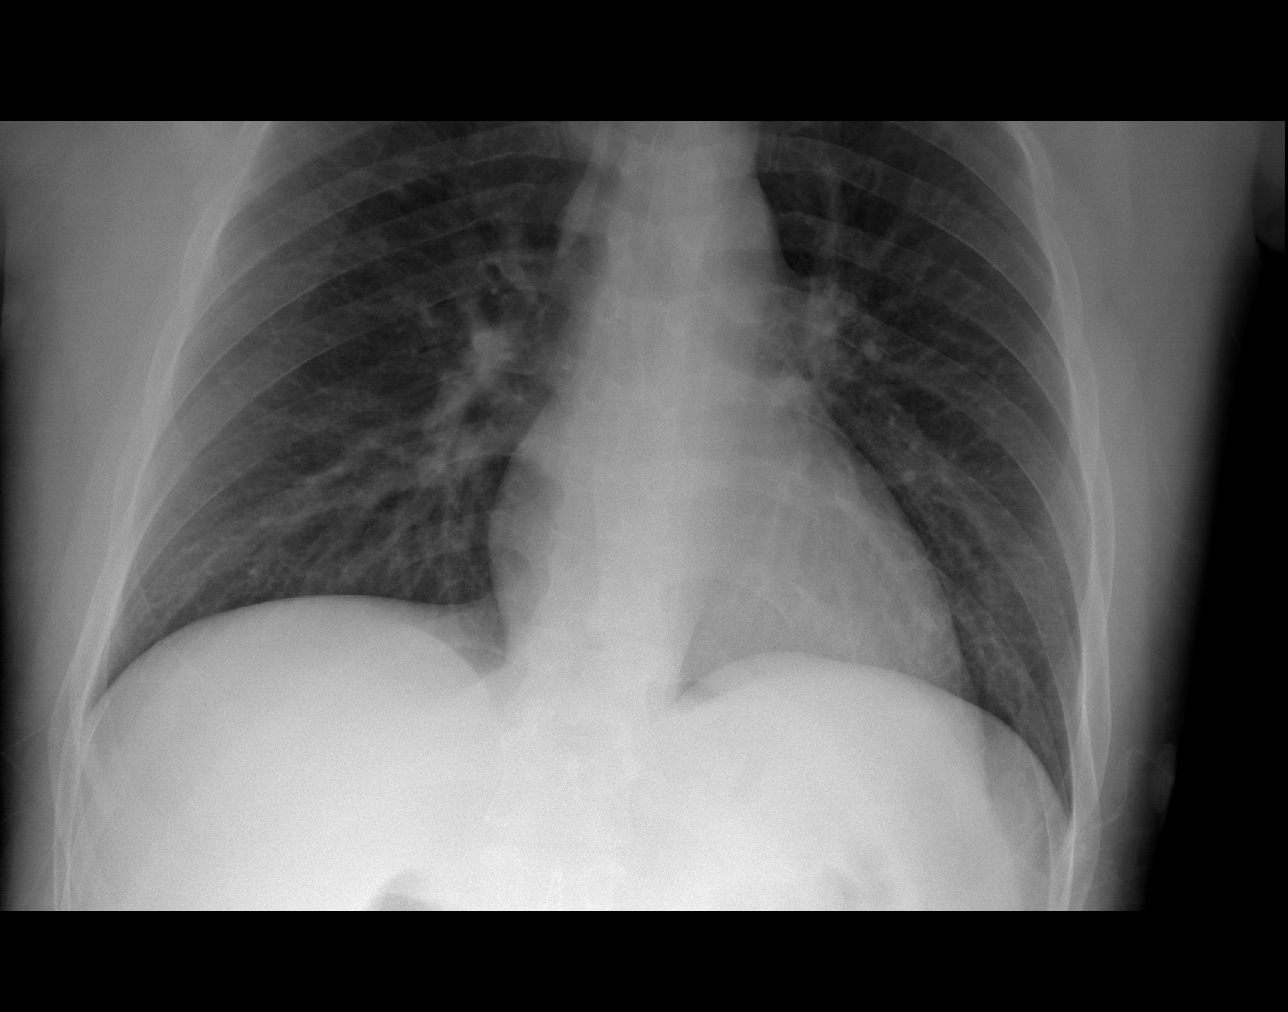

[4 of 4 positions shown; findings below may reference images not displayed]

FINDINGS: The lungs are clear. Heart size is normal. No pneumothorax or
pleural effusion.

No free intraperitoneal air is identified. Small bowel loops in the
upper abdomen measure up to 3 cm in diameter. A large volume of
stool is seen in the transverse colon.
IMPRESSION: Mildly dilated bowel loops in the upper abdomen could be due to
inflammatory process resulting in focal ileus or obstruction.

Negative for free intraperitoneal air.

No acute cardiopulmonary disease.

Large volume of stool in the transverse colon.

## 2017-08-03 ENCOUNTER — Encounter: Payer: Self-pay | Admitting: Emergency Medicine

## 2017-08-03 ENCOUNTER — Ambulatory Visit: Payer: 59 | Admitting: Family Medicine

## 2017-08-03 ENCOUNTER — Encounter: Payer: Self-pay | Admitting: Family Medicine

## 2017-08-03 DIAGNOSIS — B9689 Other specified bacterial agents as the cause of diseases classified elsewhere: Secondary | ICD-10-CM | POA: Diagnosis not present

## 2017-08-03 DIAGNOSIS — J019 Acute sinusitis, unspecified: Secondary | ICD-10-CM

## 2017-08-03 MED ORDER — AMOXICILLIN-POT CLAVULANATE 875-125 MG PO TABS: 1.0000 | ORAL_TABLET | Freq: Two times a day (BID) | ORAL | 0 refills | Status: DC

## 2017-08-03 NOTE — Progress Notes (Signed)
   Subjective:    Patient ID: Larry White, male    DOB: [birth year], 37 y.o.   MRN: [id number]  Chief Complaint  Patient presents with  . Sinus Problem    ear clogged and nasal congestion  . Fever     Sinus Problem  This is a new problem. Episode onset: 5 days. The problem has been rapidly worsening since onset. The maximum temperature recorded prior to his arrival was 100.4 - 100.9 F. The fever has been present for 1 to 2 days. The pain is moderate (Right maxillary sinus). Associated symptoms include congestion and sinus pressure (yellow/green nasal discharge). Pertinent negatives include no chills, diaphoresis, hoarse voice, neck pain, shortness of breath, sneezing or swollen glands. (Also with nausea/vomiting. ) Treatments tried: Works as a Radiation protection practitionerparamedic and had some zofran on hand and gave himself a liter of IV fluid. The treatment provided moderate relief.      Review of Systems  Constitutional: Positive for appetite change, fatigue and fever. Negative for chills and diaphoresis.  HENT: Positive for congestion, sinus pressure (yellow/green nasal discharge) and sinus pain. Negative for ear discharge, hoarse voice, nosebleeds, sneezing and trouble swallowing.   Eyes: Negative for pain and itching.  Respiratory: Negative for shortness of breath and wheezing.   Cardiovascular: Negative for chest pain.  Gastrointestinal: Positive for nausea. Negative for abdominal distention and abdominal pain.  Musculoskeletal: Negative for myalgias and neck pain.   Past Medical History:  Diagnosis Date  . Allergic rhinitis due to pollen   . Sebaceous cyst    back  . Unspecified hemorrhoids without mention of complication   . 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 APPENDECTOMY  1982  . APPENDECTOMY    . COLON SURGERY    . VOLVULUS REDUCTION  1982   Probable volvulus in 1982 as infant       Objective:   Physical Exam    Constitutional: He appears well-nourished. No distress.  HENT:  Head: Normocephalic and atraumatic.  Right Ear: Ear canal normal. A middle ear effusion (serous) is present.  Left Ear: Tympanic membrane and ear canal normal.  Nose: Mucosal edema and rhinorrhea present. Right sinus exhibits maxillary sinus tenderness and frontal sinus tenderness. Left sinus exhibits no maxillary sinus tenderness and no frontal sinus tenderness.  Mouth/Throat: Uvula is midline. Posterior oropharyngeal erythema present. No oropharyngeal exudate.  Eyes: Right conjunctiva is not injected. Left conjunctiva is not injected. No scleral icterus.  Neck: Neck supple. No thyromegaly present.  Cardiovascular: Normal rate, regular rhythm and normal heart sounds.  Pulmonary/Chest: Effort normal and breath sounds normal.  Abdominal: Soft. He exhibits no distension. There is no tenderness.  Lymphadenopathy:    He has no cervical adenopathy.  Neurological: He is alert.  Skin: No rash noted.  Psychiatric: He has a normal mood and affect. His behavior is normal.          Assessment & Plan:  Acute bacterial sinusitis History and exam consistent with bacterial sinusitis Rx for augmentin Increase fluid intake and rest Note given for off work tomorrow.  May use mucinex and/or decongestant as needed Call if worsening or not improving.

## 2017-08-03 NOTE — Patient Instructions (Signed)

## 2017-08-03 NOTE — Assessment & Plan Note (Signed)
History and exam consistent with bacterial sinusitis Rx for augmentin Increase fluid intake and rest Note given for off work tomorrow.  May use mucinex and/or decongestant as needed Call if worsening or not improving.

## 2018-03-02 DIAGNOSIS — M9903 Segmental and somatic dysfunction of lumbar region: Secondary | ICD-10-CM | POA: Diagnosis not present

## 2018-03-02 DIAGNOSIS — M545 Low back pain: Secondary | ICD-10-CM | POA: Diagnosis not present

## 2018-03-02 DIAGNOSIS — M9905 Segmental and somatic dysfunction of pelvic region: Secondary | ICD-10-CM | POA: Diagnosis not present

## 2018-03-03 DIAGNOSIS — M9903 Segmental and somatic dysfunction of lumbar region: Secondary | ICD-10-CM | POA: Diagnosis not present

## 2018-03-03 DIAGNOSIS — M545 Low back pain: Secondary | ICD-10-CM | POA: Diagnosis not present

## 2018-03-03 DIAGNOSIS — M9905 Segmental and somatic dysfunction of pelvic region: Secondary | ICD-10-CM | POA: Diagnosis not present

## 2018-03-04 DIAGNOSIS — M9903 Segmental and somatic dysfunction of lumbar region: Secondary | ICD-10-CM | POA: Diagnosis not present

## 2018-03-04 DIAGNOSIS — M545 Low back pain: Secondary | ICD-10-CM | POA: Diagnosis not present

## 2018-03-04 DIAGNOSIS — M9905 Segmental and somatic dysfunction of pelvic region: Secondary | ICD-10-CM | POA: Diagnosis not present

## 2018-03-08 DIAGNOSIS — M545 Low back pain: Secondary | ICD-10-CM | POA: Diagnosis not present

## 2018-03-08 DIAGNOSIS — M9903 Segmental and somatic dysfunction of lumbar region: Secondary | ICD-10-CM | POA: Diagnosis not present

## 2018-03-08 DIAGNOSIS — M9905 Segmental and somatic dysfunction of pelvic region: Secondary | ICD-10-CM | POA: Diagnosis not present

## 2018-03-09 DIAGNOSIS — M545 Low back pain: Secondary | ICD-10-CM | POA: Diagnosis not present

## 2018-03-09 DIAGNOSIS — M9903 Segmental and somatic dysfunction of lumbar region: Secondary | ICD-10-CM | POA: Diagnosis not present

## 2018-03-09 DIAGNOSIS — M9905 Segmental and somatic dysfunction of pelvic region: Secondary | ICD-10-CM | POA: Diagnosis not present

## 2018-03-17 DIAGNOSIS — M9905 Segmental and somatic dysfunction of pelvic region: Secondary | ICD-10-CM | POA: Diagnosis not present

## 2018-03-17 DIAGNOSIS — M9903 Segmental and somatic dysfunction of lumbar region: Secondary | ICD-10-CM | POA: Diagnosis not present

## 2018-03-17 DIAGNOSIS — M545 Low back pain: Secondary | ICD-10-CM | POA: Diagnosis not present

## 2018-03-22 DIAGNOSIS — M545 Low back pain: Secondary | ICD-10-CM | POA: Diagnosis not present

## 2018-03-22 DIAGNOSIS — M9903 Segmental and somatic dysfunction of lumbar region: Secondary | ICD-10-CM | POA: Diagnosis not present

## 2018-03-22 DIAGNOSIS — M9905 Segmental and somatic dysfunction of pelvic region: Secondary | ICD-10-CM | POA: Diagnosis not present

## 2018-03-29 DIAGNOSIS — M5136 Other intervertebral disc degeneration, lumbar region: Secondary | ICD-10-CM | POA: Diagnosis not present

## 2018-03-31 DIAGNOSIS — M9905 Segmental and somatic dysfunction of pelvic region: Secondary | ICD-10-CM | POA: Diagnosis not present

## 2018-03-31 DIAGNOSIS — M545 Low back pain: Secondary | ICD-10-CM | POA: Diagnosis not present

## 2018-03-31 DIAGNOSIS — M9903 Segmental and somatic dysfunction of lumbar region: Secondary | ICD-10-CM | POA: Diagnosis not present

## 2018-04-15 DIAGNOSIS — M545 Low back pain: Secondary | ICD-10-CM | POA: Diagnosis not present

## 2018-04-15 DIAGNOSIS — M9905 Segmental and somatic dysfunction of pelvic region: Secondary | ICD-10-CM | POA: Diagnosis not present

## 2018-04-15 DIAGNOSIS — M9903 Segmental and somatic dysfunction of lumbar region: Secondary | ICD-10-CM | POA: Diagnosis not present

## 2018-04-19 DIAGNOSIS — M9903 Segmental and somatic dysfunction of lumbar region: Secondary | ICD-10-CM | POA: Diagnosis not present

## 2018-04-19 DIAGNOSIS — M545 Low back pain: Secondary | ICD-10-CM | POA: Diagnosis not present

## 2018-04-19 DIAGNOSIS — M9905 Segmental and somatic dysfunction of pelvic region: Secondary | ICD-10-CM | POA: Diagnosis not present

## 2018-04-28 DIAGNOSIS — M545 Low back pain: Secondary | ICD-10-CM | POA: Diagnosis not present

## 2018-04-28 DIAGNOSIS — M9903 Segmental and somatic dysfunction of lumbar region: Secondary | ICD-10-CM | POA: Diagnosis not present

## 2018-04-28 DIAGNOSIS — M9905 Segmental and somatic dysfunction of pelvic region: Secondary | ICD-10-CM | POA: Diagnosis not present

## 2018-05-07 DIAGNOSIS — M9903 Segmental and somatic dysfunction of lumbar region: Secondary | ICD-10-CM | POA: Diagnosis not present

## 2018-05-07 DIAGNOSIS — M545 Low back pain: Secondary | ICD-10-CM | POA: Diagnosis not present

## 2018-05-07 DIAGNOSIS — M9905 Segmental and somatic dysfunction of pelvic region: Secondary | ICD-10-CM | POA: Diagnosis not present

## 2019-06-27 ENCOUNTER — Ambulatory Visit: Payer: Self-pay | Attending: Internal Medicine

## 2019-06-27 DIAGNOSIS — Z20822 Contact with and (suspected) exposure to covid-19: Secondary | ICD-10-CM | POA: Insufficient documentation

## 2019-06-28 LAB — NOVEL CORONAVIRUS, NAA: SARS-CoV-2, NAA: NOT DETECTED

## 2019-10-07 ENCOUNTER — Ambulatory Visit: Payer: Self-pay | Attending: Internal Medicine

## 2019-10-07 DIAGNOSIS — Z23 Encounter for immunization: Secondary | ICD-10-CM

## 2019-10-07 NOTE — Progress Notes (Signed)
   Covid-19 Vaccination Clinic  Name:  BENZ VANDENBERGHE    MRN: [id number] DOB: 1981/03/31  10/07/2019  Mr. Pierre was observed post Covid-19 immunization for 15 minutes without incident. He was provided with Vaccine Information Sheet and instruction to access the V-Safe system.   Mr. Reppert was instructed to call 911 with any severe reactions post vaccine: Marland Kitchen Difficulty breathing  . Swelling of face and throat  . A fast heartbeat  . A bad rash all over body  . Dizziness and weakness   Immunizations Administered    Name Date Dose VIS Date Route   Pfizer COVID-19 Vaccine 10/07/2019  2:25 PM 0.3 mL 06/08/2018 Intramuscular   Manufacturer: [employer], [employer]   Lot: [id number]   NDC: [address]-7

## 2019-10-28 ENCOUNTER — Ambulatory Visit: Payer: Self-pay | Attending: Internal Medicine

## 2019-10-28 DIAGNOSIS — Z23 Encounter for immunization: Secondary | ICD-10-CM

## 2019-10-28 NOTE — Progress Notes (Signed)
   Covid-19 Vaccination Clinic  Name:  Larry White    MRN: [id number] DOB: Sep 19, 1980  10/28/2019  [name] was observed post Covid-19 immunization for 15 minutes without incident. He was provided with Vaccine Information Sheet and instruction to access the V-Safe system.   [name] was instructed to call 911 with any severe reactions post vaccine: Marland Kitchen Difficulty breathing  . Swelling of face and throat  . A fast heartbeat  . A bad rash all over body  . Dizziness and weakness   Immunizations Administered    Name Date Dose VIS Date Route   Pfizer COVID-19 Vaccine 10/28/2019  2:32 PM 0.3 mL 06/08/2018 Intramuscular   Manufacturer: [employer], [employer]   Lot: [id number]   NDC: [address]-3

## 2021-03-18 ENCOUNTER — Telehealth: Payer: Self-pay | Admitting: Family Medicine

## 2021-03-18 NOTE — Telephone Encounter (Signed)
I have seen him twice in 12 years.  If he has a very strong preference, I could see him, but seeing traditional PCP with availability would likely be of benefit for him.

## 2021-03-18 NOTE — Telephone Encounter (Signed)
Please have patient re-establish care with [facility] or Tab[facility]a.  Dr. Patsy Lager is not taking any new patients for primary care.

## 2021-03-18 NOTE — Telephone Encounter (Signed)
Re-Establish or set up with Susy [facility]zzle or Tabitha?

## 2021-03-18 NOTE — Telephone Encounter (Signed)
Pt called in wanting some immunizations done and blood work but pt has not seen provider in over 3 years. Pt would like to reestablish care with provider. Pt has to get these things done before January because pt is trying to get into school

## 2021-03-18 NOTE — Telephone Encounter (Signed)
Called Larry White and got him scheduled on 12/21 at 9:40 am

## 2021-04-03 ENCOUNTER — Encounter: Payer: Self-pay | Admitting: Nurse Practitioner

## 2021-04-03 ENCOUNTER — Other Ambulatory Visit: Payer: Self-pay

## 2021-04-03 ENCOUNTER — Ambulatory Visit (INDEPENDENT_AMBULATORY_CARE_PROVIDER_SITE_OTHER): Payer: Managed Care, Other (non HMO) | Admitting: Nurse Practitioner

## 2021-04-03 VITALS — BP 120/80 | HR 85 | Temp 98.1°F | Ht 71.0 in | Wt 211.0 lb

## 2021-04-03 DIAGNOSIS — Z0000 Encounter for general adult medical examination without abnormal findings: Secondary | ICD-10-CM | POA: Diagnosis not present

## 2021-04-03 DIAGNOSIS — Z789 Other specified health status: Secondary | ICD-10-CM

## 2021-04-03 DIAGNOSIS — Z23 Encounter for immunization: Secondary | ICD-10-CM | POA: Diagnosis not present

## 2021-04-03 LAB — HEMOGLOBIN A1C: Hgb A1c MFr Bld: 5.9 % (ref 4.6–6.5)

## 2021-04-03 LAB — COMPREHENSIVE METABOLIC PANEL
ALT: 36 U/L (ref 0–53)
AST: 22 U/L (ref 0–37)
Albumin: 4.8 g/dL (ref 3.5–5.2)
Alkaline Phosphatase: 55 U/L (ref 39–117)
BUN: 16 mg/dL (ref 6–23)
CO2: 31 mEq/L (ref 19–32)
Calcium: 10.1 mg/dL (ref 8.4–10.5)
Chloride: 100 mEq/L (ref 96–112)
Creatinine, Ser: 1.08 mg/dL (ref 0.40–1.50)
GFR: 85.93 mL/min (ref >60.00)
Glucose, Bld: 82 mg/dL (ref 70–99)
Potassium: 4.2 mEq/L (ref 3.5–5.1)
Sodium: 137 mEq/L (ref 135–145)
Total Bilirubin: 0.5 mg/dL (ref 0.2–1.2)
Total Protein: 7.8 g/dL (ref 6.0–8.3)

## 2021-04-03 LAB — LIPID PANEL
Cholesterol: 192 mg/dL (ref 0–200)
HDL: 46.2 mg/dL (ref >39.00)
LDL Cholesterol: 106 mg/dL — ABNORMAL HIGH (ref 0–99)
NonHDL: 145.76
Total CHOL/HDL Ratio: 4
Triglycerides: 198 mg/dL — ABNORMAL HIGH (ref 0.0–149.0)
VLDL: 39.6 mg/dL (ref 0.0–40.0)

## 2021-04-03 LAB — CBC
HCT: 44.4 % (ref 39.0–52.0)
Hemoglobin: 14.9 g/dL (ref 13.0–17.0)
MCHC: 33.6 g/dL (ref 30.0–36.0)
MCV: 88.4 fl (ref 78.0–100.0)
Platelets: 184 10*3/uL (ref 150.0–400.0)
RBC: 5.03 Mil/uL (ref 4.22–5.81)
RDW: 12.6 % (ref 11.5–15.5)
WBC: 5.7 10*3/uL (ref 4.0–10.5)

## 2021-04-03 NOTE — Patient Instructions (Signed)
Nice to see you today Will be in touch regarding the labs Follow up with me in a year, sooner if needed

## 2021-04-03 NOTE — Assessment & Plan Note (Signed)
Tdap administered in office.

## 2021-04-03 NOTE — Progress Notes (Signed)
Established Patient Office Visit  Subjective:  Patient ID: Larry White, male    DOB: Sep 16, 1980  Age: 40 y.o. MRN: [id number]  CC:  Chief Complaint  Patient presents with   New Patient (Initial Visit)    Would like labs done, and discuss needed vaccines for school     HPI Larry White presents for complete physical and follow up of chronic conditions.  Immunizations: -Tetanus: 2010 -Influenza: Give today -Covid-19: pfizer x2 and 1 booster -Shingles: NA -Pneumonia: NA  -HPV:?  Diet: Fair diet.  Eat 3 times daily. Eats at home. Snacks in between. 2-3 cups of coffee with almond milk creamer. Lots of water. 1 soda Exercise: No regular exercise. Does like to hike, tennis, and walk  Eye exam: Completes annually  Dental exam: Completes semi-annually    Dexa: Completed in Colonoscopy: NA PSA: NA  Lung Cancer Screening: NA   MMr titer and TDAP for school [facility]). Starts in January.  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    Past Surgical History:  Procedure Laterality Date   APPENDECTOMY  1982   APPENDECTOMY     COLON SURGERY     VOLVULUS REDUCTION  1982   Probable volvulus in 1982 as infant    Family History  Problem Relation Age of Onset   Diabetes Father    Hyperlipidemia Mother    Hypertension Mother    Hyperlipidemia Maternal Grandmother    Heart disease Maternal Grandmother    Emphysema Maternal Grandmother    Heart disease Paternal Grandfather     Social History   Socioeconomic History   Marital status: Married    Spouse name: Not on file   Number of children: 2   Years of education: Not on file   Highest education level: Not on file  Occupational History   Occupation: paramedic    Employer: [employer]   Tobacco Use   Smoking status: Never   Smokeless tobacco: Never  Substance and Sexual Activity   Alcohol use: Yes    Alcohol/week: 0.0 standard drinks    Comment: occ beer   Drug use: No   Sexual activity: Yes    Partners: Female  Other Topics Concern   Not on file  Social History Narrative   ** Merged History Encounter **       Paramedic   Married with 2 small children. (boy and girl)   5 and 3 as of 2015   Regular exercise - faded some now   Social Determinants of Radio broadcast assistant Strain: Not on file  Food Insecurity: Not on file  Transportation Needs: Not on file  Physical Activity: Not on file  Stress: Not on file  Social Connections: Not on file  Intimate Partner Violence: Not on file    Outpatient Medications Prior to Visit  Medication Sig Dispense Refill   dicyclomine (BENTYL) 20 MG tablet Take 1 tablet (20 mg total) by mouth 3 (three) times daily as needed for spasms (intestinal spasm). 20 tablet 0   amoxicillin-clavulanate (AUGMENTIN) 875-125 MG tablet Take 1 tablet by mouth 2 (two) times daily. 20 tablet 0   ondansetron (ZOFRAN ODT) 4 MG disintegrating tablet Take 1 tablet (4 mg total) by mouth  every 8 (eight) hours as needed for nausea or vomiting. (Patient not taking: Reported on 01/08/2016) 20 tablet 0   No facility-administered medications prior to visit.    No Known Allergies  ROS Review of Systems  Constitutional:  Negative for chills, fatigue and fever.  Respiratory:  Negative for cough and shortness of breath.   Cardiovascular:  Negative for chest pain, palpitations and leg swelling.  Gastrointestinal:  Negative for abdominal pain, blood in stool, nausea and vomiting.  Genitourinary:  Negative for dysuria, frequency, hematuria, penile pain, penile swelling, scrotal swelling and testicular pain.       Nocturia negative   Neurological:  Negative for dizziness, weakness, light-headedness, numbness and headaches.  Psychiatric/Behavioral:   Negative for hallucinations and suicidal ideas.    PHQ9 SCORE ONLY 04/03/2021 12/18/2014 12/13/2014  PHQ-9 Total Score 0 0 0      Objective:    Physical Exam Vitals and nursing note reviewed.  Constitutional:      Appearance: Normal appearance.  HENT:     Right Ear: Tympanic membrane, ear canal and external ear normal. There is no impacted cerumen.     Left Ear: Tympanic membrane, ear canal and external ear normal. There is no impacted cerumen.     Mouth/Throat:     Mouth: Mucous membranes are moist.     Pharynx: Oropharynx is clear.  Eyes:     Extraocular Movements: Extraocular movements intact.     Pupils: Pupils are equal, round, and reactive to light.     Comments: Wears corrective lenses   Neck:     Thyroid: No thyroid mass, thyromegaly or thyroid tenderness.  Cardiovascular:     Rate and Rhythm: Normal rate and regular rhythm.     Pulses: Normal pulses.     Heart sounds: Normal heart sounds.  Pulmonary:     Effort: Pulmonary effort is normal.     Breath sounds: Normal breath sounds.  Abdominal:     General: Abdomen is flat. Bowel sounds are normal. There is no distension.     Palpations: There is no mass.     Tenderness: There is no abdominal tenderness.  Genitourinary:    Comments: Patient deferred. States he does self checks in the shower Musculoskeletal:     Right lower leg: No edema.     Left lower leg: No edema.     Comments: Bilateral upper and lower extremity strength 5/5  Lymphadenopathy:     Cervical: No cervical adenopathy.  Skin:    General: Skin is warm.  Neurological:     General: No focal deficit present.     Mental Status: He is alert.     Deep Tendon Reflexes:     Reflex Scores:      Bicep reflexes are 2+ on the right side and 2+ on the left side.      Patellar reflexes are 2+ on the right side and 2+ on the left side. Psychiatric:        Mood and Affect: Mood normal.        Behavior: Behavior normal.        Thought Content: Thought content  normal.        Judgment: Judgment normal.    BP 120/80 (BP Location: Left Arm, Patient Position: Sitting, Cuff Size: Normal)    Pulse 85    Temp 98.1 F (36.7 C) (Temporal)    Ht '5\' 11"'  (1.803 m)    Wt 211 lb (95.7 kg)    SpO2 98%  BMI 29.43 kg/m  Wt Readings from Last 3 Encounters:  04/03/21 211 lb (95.7 kg)  01/08/16 210 lb (95.3 kg)  01/16/15 200 lb 12.8 oz (91.1 kg)     Health Maintenance Due  Topic Date Due   HIV Screening  Never done   Hepatitis C Screening  Never done   TETANUS/TDAP  12/22/2018   COVID-19 Vaccine (4 - Booster for Pfizer series) 06/30/2020   INFLUENZA VACCINE  Never done    There are no preventive care reminders to display for this patient.  No results found for: TSH Lab Results  Component Value Date   WBC 6.2 01/08/2016   HGB 14.5 01/08/2016   HCT 43.7 01/08/2016   MCV 88.5 01/08/2016   PLT 197 01/08/2016   Lab Results  Component Value Date   NA 141 01/08/2016   K 4.1 01/08/2016   CO2 28 01/08/2016   GLUCOSE 115 (H) 01/08/2016   BUN 17 01/08/2016   CREATININE 1.07 01/08/2016   BILITOT 0.6 01/08/2016   ALKPHOS 57 01/08/2016   AST 27 01/08/2016   ALT 40 01/08/2016   PROT 8.6 (H) 01/08/2016   ALBUMIN 5.0 01/08/2016   CALCIUM 10.0 01/08/2016   ANIONGAP 10 01/08/2016   GFR 78.88 05/19/2013   Lab Results  Component Value Date   CHOL 162 05/19/2013   Lab Results  Component Value Date   HDL 51.40 05/19/2013   Lab Results  Component Value Date   LDLCALC 99 05/19/2013   Lab Results  Component Value Date   TRIG 59.0 05/19/2013   Lab Results  Component Value Date   CHOLHDL 3 05/19/2013   No results found for: HGBA1C    Assessment & Plan:   Problem List Items Addressed This Visit       Other   Preventative health care - Primary    Discussed appropriate immunizations and screening exams.  Continue working towards a healthy lifestyle      Relevant Orders   CBC (Completed)   Comprehensive metabolic panel (Completed)    Hemoglobin A1c (Completed)   Lipid panel (Completed)   Need for tetanus booster    Tdap administered in office.      Relevant Orders   Tdap vaccine greater than or equal to 7yo IM (Completed)   History of measles, mumps, rubella (MMR) vaccination unknown    Patient states he does have his shot records with him.  States he felt he got one of the 2 MMR vaccines is required either have a titer booster college purposes.  Pending MMR titer in office today      Relevant Orders   Measles/Mumps/Rubella Immunity   Need for immunization against influenza    Flu vaccine administered in office      Relevant Orders   Flu Vaccine QUAD 69moIM (Fluarix, Fluzone & Alfiuria Quad PF) (Completed)    No orders of the defined types were placed in this encounter.   Follow-up: Return in about 1 year (around 04/03/2022) for cpe.    MRomilda Garret NP

## 2021-04-03 NOTE — Assessment & Plan Note (Signed)
Flu vaccine administered in office. 

## 2021-04-03 NOTE — Assessment & Plan Note (Signed)
Patient states he does have his shot records with him.  States he felt he got one of the 2 MMR vaccines is required either have a titer booster college purposes.  Pending MMR titer in office today

## 2021-04-03 NOTE — Assessment & Plan Note (Signed)
Discussed appropriate immunizations and screening exams.  Continue working towards a healthy lifestyle

## 2021-04-04 ENCOUNTER — Encounter: Payer: Self-pay | Admitting: Nurse Practitioner

## 2021-04-04 LAB — MEASLES/MUMPS/RUBELLA IMMUNITY
Mumps IgG: 19.1 AU/mL
Rubella: 0.93 Index — ABNORMAL LOW
Rubeola IgG: 66.6 AU/mL

## 2021-04-11 ENCOUNTER — Encounter: Payer: Self-pay | Admitting: Nurse Practitioner

## 2021-04-16 ENCOUNTER — Ambulatory Visit (INDEPENDENT_AMBULATORY_CARE_PROVIDER_SITE_OTHER): Payer: Managed Care, Other (non HMO)

## 2021-04-16 ENCOUNTER — Ambulatory Visit: Payer: Managed Care, Other (non HMO) | Admitting: Nurse Practitioner

## 2021-04-16 ENCOUNTER — Other Ambulatory Visit: Payer: Self-pay

## 2021-04-16 DIAGNOSIS — Z23 Encounter for immunization: Secondary | ICD-10-CM | POA: Diagnosis not present

## 2021-04-16 NOTE — Progress Notes (Signed)
Per orders of Julieta Bellini, NP injection of MMR vaccine given by Kris Mouton. Patient tolerated injection well.   Per provider patient does not need another MMR vaccine after this as of now.

## 2021-10-23 ENCOUNTER — Inpatient Hospital Stay (HOSPITAL_COMMUNITY): Payer: Managed Care, Other (non HMO)

## 2021-10-23 ENCOUNTER — Emergency Department (HOSPITAL_COMMUNITY): Payer: Managed Care, Other (non HMO)

## 2021-10-23 ENCOUNTER — Other Ambulatory Visit: Payer: Self-pay

## 2021-10-23 ENCOUNTER — Encounter (HOSPITAL_COMMUNITY): Payer: Self-pay

## 2021-10-23 ENCOUNTER — Inpatient Hospital Stay (HOSPITAL_COMMUNITY)
Admission: EM | Admit: 2021-10-23 | Discharge: 2021-10-25 | DRG: 390 | Disposition: A | Discharge status: Home / Self Care | Payer: Managed Care, Other (non HMO) | Attending: Surgery | Admitting: Surgery

## 2021-10-23 DIAGNOSIS — Q433 Congenital malformations of intestinal fixation: Principal | ICD-10-CM

## 2021-10-23 DIAGNOSIS — Z87891 Personal history of nicotine dependence: Secondary | ICD-10-CM

## 2021-10-23 DIAGNOSIS — K56600 Partial intestinal obstruction, unspecified as to cause: Secondary | ICD-10-CM | POA: Diagnosis present

## 2021-10-23 DIAGNOSIS — D72829 Elevated white blood cell count, unspecified: Secondary | ICD-10-CM | POA: Diagnosis present

## 2021-10-23 DIAGNOSIS — K56609 Unspecified intestinal obstruction, unspecified as to partial versus complete obstruction: Secondary | ICD-10-CM

## 2021-10-23 LAB — LIPASE, BLOOD: Lipase: 22 U/L (ref 11–51)

## 2021-10-23 LAB — COMPREHENSIVE METABOLIC PANEL
ALT: 30 U/L (ref 0–44)
AST: 30 U/L (ref 15–41)
Albumin: 4.6 g/dL (ref 3.5–5.0)
Alkaline Phosphatase: 55 U/L (ref 38–126)
Anion gap: 13 (ref 5–15)
BUN: 16 mg/dL (ref 6–20)
CO2: 22 mmol/L (ref 22–32)
Calcium: 9.5 mg/dL (ref 8.9–10.3)
Chloride: 101 mmol/L (ref 98–111)
Creatinine, Ser: 0.98 mg/dL (ref 0.61–1.24)
GFR, Estimated: >60 mL/min (ref >60)
Glucose, Bld: 198 mg/dL — ABNORMAL HIGH (ref 70–99)
Potassium: 3.6 mmol/L (ref 3.5–5.1)
Sodium: 136 mmol/L (ref 135–145)
Total Bilirubin: 0.9 mg/dL (ref 0.3–1.2)
Total Protein: 7.8 g/dL (ref 6.5–8.1)

## 2021-10-23 LAB — CBC WITH DIFFERENTIAL/PLATELET
Abs Immature Granulocytes: 0.04 10*3/uL (ref 0.00–0.07)
Basophils Absolute: 0 10*3/uL (ref 0.0–0.1)
Basophils Relative: 0 %
Eosinophils Absolute: 0 10*3/uL (ref 0.0–0.5)
Eosinophils Relative: 0 %
HCT: 40.5 % (ref 39.0–52.0)
Hemoglobin: 14.1 g/dL (ref 13.0–17.0)
Immature Granulocytes: 0 %
Lymphocytes Relative: 5 %
Lymphs Abs: 0.6 10*3/uL — ABNORMAL LOW (ref 0.7–4.0)
MCH: 30.5 pg (ref 26.0–34.0)
MCHC: 34.8 g/dL (ref 30.0–36.0)
MCV: 87.5 fL (ref 80.0–100.0)
Monocytes Absolute: 0.3 10*3/uL (ref 0.1–1.0)
Monocytes Relative: 2 %
Neutro Abs: 11 10*3/uL — ABNORMAL HIGH (ref 1.7–7.7)
Neutrophils Relative %: 93 %
Platelets: 237 10*3/uL (ref 150–400)
RBC: 4.63 MIL/uL (ref 4.22–5.81)
RDW: 11.8 % (ref 11.5–15.5)
WBC: 12 10*3/uL — ABNORMAL HIGH (ref 4.0–10.5)
nRBC: 0 % (ref 0.0–0.2)

## 2021-10-23 MED ORDER — DIPHENHYDRAMINE HCL 50 MG/ML IJ SOLN: 25.0000 mg | Freq: Four times a day (QID) | INTRAMUSCULAR | Status: DC | PRN

## 2021-10-23 MED ORDER — HYDROMORPHONE HCL 1 MG/ML IJ SOLN
1.0000 mg | Freq: Once | INTRAMUSCULAR | Status: AC
  Administered 2021-10-23: 1 mg via INTRAVENOUS
  Filled 2021-10-23: qty 1

## 2021-10-23 MED ORDER — ONDANSETRON 4 MG PO TBDP: 4.0000 mg | ORAL_TABLET | Freq: Four times a day (QID) | ORAL | Status: DC | PRN

## 2021-10-23 MED ORDER — MORPHINE SULFATE (PF) 4 MG/ML IV SOLN
4.0000 mg | INTRAVENOUS | Status: DC | PRN
  Administered 2021-10-23: 4 mg via INTRAVENOUS
  Filled 2021-10-23 (×2): qty 1

## 2021-10-23 MED ORDER — ONDANSETRON HCL 4 MG/2ML IJ SOLN
4.0000 mg | Freq: Four times a day (QID) | INTRAMUSCULAR | Status: DC | PRN
  Administered 2021-10-23: 4 mg via INTRAVENOUS
  Filled 2021-10-23: qty 2

## 2021-10-23 MED ORDER — ONDANSETRON HCL 4 MG/2ML IJ SOLN
4.0000 mg | Freq: Once | INTRAMUSCULAR | Status: AC
  Administered 2021-10-23: 4 mg via INTRAVENOUS
  Filled 2021-10-23: qty 2

## 2021-10-23 MED ORDER — POTASSIUM CHLORIDE IN NACL 20-0.9 MEQ/L-% IV SOLN
INTRAVENOUS | Status: DC
  Filled 2021-10-23 (×5): qty 1000

## 2021-10-23 MED ORDER — IOHEXOL 300 MG/ML  SOLN
100.0000 mL | Freq: Once | INTRAMUSCULAR | Status: AC | PRN
Start: 2021-10-23 — End: 2021-10-23
  Administered 2021-10-23: 100 mL via INTRAVENOUS

## 2021-10-23 MED ORDER — SODIUM CHLORIDE (PF) 0.9 % IJ SOLN
INTRAMUSCULAR | Status: AC
  Filled 2021-10-23: qty 50

## 2021-10-23 MED ORDER — KETOROLAC TROMETHAMINE 15 MG/ML IJ SOLN: 15.0000 mg | Freq: Four times a day (QID) | INTRAMUSCULAR | Status: DC | PRN

## 2021-10-23 MED ORDER — SODIUM CHLORIDE 0.9 % IV BOLUS
1000.0000 mL | Freq: Once | INTRAVENOUS | Status: AC
  Administered 2021-10-23: 1000 mL via INTRAVENOUS

## 2021-10-23 MED ORDER — DIPHENHYDRAMINE HCL 25 MG PO CAPS: 25.0000 mg | ORAL_CAPSULE | Freq: Four times a day (QID) | ORAL | Status: DC | PRN

## 2021-10-23 MED ORDER — ENOXAPARIN SODIUM 40 MG/0.4ML IJ SOSY
40.0000 mg | PREFILLED_SYRINGE | INTRAMUSCULAR | Status: DC
  Administered 2021-10-25: 40 mg via SUBCUTANEOUS
  Filled 2021-10-23 (×2): qty 0.4

## 2021-10-23 MED ORDER — HYDROMORPHONE HCL 1 MG/ML IJ SOLN
1.0000 mg | Freq: Once | INTRAMUSCULAR | Status: AC
  Administered 2021-10-23: 1 mg via INTRAVENOUS
  Filled 2021-10-23 (×2): qty 1

## 2021-10-23 MED ORDER — DIATRIZOATE MEGLUMINE & SODIUM 66-10 % PO SOLN
90.0000 mL | Freq: Once | ORAL | Status: AC
Start: 2021-10-23 — End: 2021-10-23
  Administered 2021-10-23: 90 mL via NASOGASTRIC
  Filled 2021-10-23: qty 90

## 2021-10-23 MED ORDER — SIMETHICONE 80 MG PO CHEW: 40.0000 mg | CHEWABLE_TABLET | Freq: Four times a day (QID) | ORAL | Status: DC | PRN

## 2021-10-23 NOTE — ED Provider Notes (Addendum)
[facility]-EMERGENCY DEPT Provider Note   CSN: [id number] Arrival date & time: 10/23/21  0014     History  Chief Complaint  Patient presents with   Abdominal Pain    Larry White is a 41 y.o. male.   Abdominal Pain   Patient with medical history of volvulus neonatorum, status post appendectomy, volvulus reduction presents today with abdominal pain "all over".  Started at 7 AM, its been constant throughout the day.  Positions make it worse, unable to find any comfortable position.  The pain does not radiate elsewhere, associated with nausea and 3 episodes of emesis.  Denies any blood in the stool or in his vomit.  He is having regular bowel movements, and no diarrhea.  Not having any fevers.  He tried Bentyl with no improvement.  Given fentanyl and ketamine by EMS and states that did not help either.  Patient did have an episode of possible GI poisoning a few days ago where he had vomiting and diarrhea, this resolved for over 48 hours ago.  No recent antibiotic use.  Home Medications Prior to Admission medications   Medication Sig Start Date End Date Taking? Authorizing Provider  dicyclomine (BENTYL) 20 MG tablet Take 1 tablet (20 mg total) by mouth 3 (three) times daily as needed for spasms (intestinal spasm). 01/08/16   Rolland Porter, MD      Allergies    Patient has no known allergies.    Review of Systems   Review of Systems  Gastrointestinal:  Positive for abdominal pain.    Physical Exam Updated Vital Signs BP 127/80   Pulse 62   Temp 98.6 F (37 C)   Resp 17   SpO2 100%  Physical Exam Vitals and nursing note reviewed. Exam conducted with a chaperone present.  Constitutional:      Appearance: Normal appearance.  HENT:     Head: Normocephalic and atraumatic.  Eyes:     General: No scleral icterus.       Right eye: No discharge.        Left eye: No discharge.     Extraocular Movements: Extraocular movements intact.     Pupils: Pupils  are equal, round, and reactive to light.  Cardiovascular:     Rate and Rhythm: Normal rate and regular rhythm.     Pulses: Normal pulses.     Heart sounds: Normal heart sounds. No murmur heard.    No friction rub. No gallop.  Pulmonary:     Effort: Pulmonary effort is normal. No respiratory distress.     Breath sounds: Normal breath sounds.  Abdominal:     General: Abdomen is flat. A surgical scar is present. Bowel sounds are normal. There is no distension.     Palpations: Abdomen is soft.     Tenderness: There is generalized abdominal tenderness. There is no right CVA tenderness, left CVA tenderness or rebound.  Skin:    General: Skin is warm and dry.     Coloration: Skin is not jaundiced.  Neurological:     Mental Status: He is alert. Mental status is at baseline.     Coordination: Coordination normal.     ED Results / Procedures / Treatments   Labs (all labs ordered are listed, but only abnormal results are displayed) Labs Reviewed  CBC WITH DIFFERENTIAL/PLATELET - Abnormal; Notable for the following components:      Result Value   WBC 12.0 (*)    Neutro Abs 11.0 (*)  Lymphs Abs 0.6 (*)    All other components within normal limits  COMPREHENSIVE METABOLIC PANEL - Abnormal; Notable for the following components:   Glucose, Bld 198 (*)    All other components within normal limits  LIPASE, BLOOD  URINALYSIS, ROUTINE W REFLEX MICROSCOPIC    EKG None  Radiology CT Abdomen Pelvis W Contrast  Result Date: 10/23/2021 CLINICAL DATA:  Abdominal pain, acute, nonlocalized EXAM: CT ABDOMEN AND PELVIS WITH CONTRAST TECHNIQUE: Multidetector CT imaging of the abdomen and pelvis was performed using the standard protocol following bolus administration of intravenous contrast. RADIATION DOSE REDUCTION: This exam was performed according to the departmental dose-optimization program which includes automated exposure control, adjustment of the mA and/or kV according to patient size and/or  use of iterative reconstruction technique. CONTRAST:  OMNIPAQUE IOHEXOL 300 MG/ML  SOLN COMPARISON:  01/08/2016 FINDINGS: Lower chest: No acute abnormality Hepatobiliary: No suspicious focal hepatic abnormality. No biliary ductal dilatation. Gallbladder unremarkable. Pancreas: No focal abnormality or ductal dilatation. Spleen: No focal abnormality.  Normal size. Adrenals/Urinary Tract: No adrenal abnormality. No focal renal abnormality. No stones or hydronephrosis. Urinary bladder is unremarkable. Stomach/Bowel: Evidence of malrotation. The cecum is to the left of midline and the ascending colon is midline. Mildly prominent central small bowel loops. There appears to be fecalization of the small bowel loops. There is also mild gaseous distention of the cecum and ascending colon. There appears to be wall thickening within the distal ascending colon. This could reflect colitis. Stomach and proximal small bowel decompressed. Vascular/Lymphatic: No evidence of aneurysm or adenopathy. Reproductive: No visible focal abnormality. Other: No free fluid or free air. Musculoskeletal: No acute bony abnormality. IMPRESSION: Evidence of malrotation. Cecum is left of midline and ascending colon is midline. There is an area of wall thickening in the distal ascending colon which could reflect colitis. This may be causing mild functional obstruction as the right colon proximal to this is dilated as are distal small bowel loops which are fecalized. Electronically Signed   By: Charlett Nose M.D.   On: 10/23/2021 03:49    Procedures Procedures    Medications Ordered in ED Medications  sodium chloride (PF) 0.9 % injection (  Not Given 10/23/21 0257)  HYDROmorphone (DILAUDID) injection 1 mg (has no administration in time range)  sodium chloride 0.9 % bolus 1,000 mL (has no administration in time range)  HYDROmorphone (DILAUDID) injection 1 mg (1 mg Intravenous Given 10/23/21 0118)  ondansetron (ZOFRAN) injection 4 mg (4 mg  Intravenous Given 10/23/21 0309)  iohexol (OMNIPAQUE) 300 MG/ML solution 100 mL (100 mLs Intravenous Contrast Given 10/23/21 0318)    ED Course/ Medical Decision Making/ A&P Clinical Course as of 10/23/21 0418  Wed Oct 23, 2021  0331 Comprehensive metabolic panel(!) Patient is slightly hyperglycemic at 198.  No gross electrolyte derangement, AKI or transaminitis. [HS]  0332 Lipase, blood Lipase is unremarkable, not consistent with pancreatitis [HS]  0333 CBC with Differential(!) Patient has a leukocytosis of 12 with left shift neutrophilic predominance at 11.  No underlying anemia [HS]  0359 CT Abdomen Pelvis W Contrast I ordered and viewed the CT of abdomen and pelvis with contrast.  Concerning for possible malrotation, also some evidence of possible colitis.  I will consult general surgery. [HS]  0400 Patient has not had anything to eat or drink for the last 24 hours.  Not on blood thinners.  He was actively vomiting prior to CT scan, the Zofran helped.  Pain is controlled after the Dilaudid but  patient states is returning slowly. [HS]  [id number] I spoke with Dr. Corliss Skains with general surgery.  He states he will admit the patient.  I read him the impression on the CT scan including evidence of malrotation, colitis, functional obstruction. Advises fluids, NG tube, n.p.o. [HS]    Clinical Course User Index [HS] Theron Arista, PA-C                           Medical Decision Making Amount and/or Complexity of Data Reviewed External Data Reviewed: labs, radiology and ECG.    Details: See HPI Labs: ordered. Decision-making details documented in ED Course. Radiology: ordered and independent interpretation performed. Decision-making details documented in ED Course. ECG/medicine tests:  Decision-making details documented in ED Course.  Risk Prescription drug management. Decision regarding hospitalization.  Critical Care Total time providing critical care: 30 minutes   Patient presents due to  abdominal pain, nausea and vomiting.  See ED course interpretation of laboratory work-up that I ordered, viewed and interpreted.  I also ordered and viewed the imaging as documented in ED course.  Patient has evidence of malrotation on CT scan, signs of colitis and functional obstruction.  Given his malrotation is distinctly different from his previous scan in 2017.  I spoke with general surgery, they will admit the patient to their service and evaluate.  I have ordered fluids, NG tube and will keep patient n.p.o.        Final Clinical Impression(s) / ED Diagnoses Final diagnoses:  Malrotation of intestine    Rx / DC Orders ED Discharge Orders     None         Theron Arista, [place] 10/23/21 [id number]    Shon Baton, MD 10/23/21 0607    Theron Arista, PA-C 01/09/22 [id number]    Shon Baton, MD 01/12/22 2253

## 2021-10-23 NOTE — ED Triage Notes (Signed)
Patient complains of abdominal pain .Has an extensive  abdominal h/x. Complains of nausea and vomiting . Given of fentanyl anf 17..4mg  of ketamine on the field with no relief. Patient is passing gas and had a bowl movement this morning .

## 2021-10-23 NOTE — H&P (Signed)
Larry White 20-Nov-1980  [id number].    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  States the pain started yesterday afternoon while playing tennis.  He tried taking Bentyl without relief of symptoms. Does say that he gets episodes of cramping pain that usually resolves with Bentyl but episode is more severe.  States it was associated with nausea and vomiting.  Last episode of flatus was yesterday around the time he was playing tennis, last bowel movement was yesterday morning.  Prior to that he reports normal bowel movements but does state that on July 6 and 7 of last week he had some diarrhea and vomiting.  Denies travel, new foods, denies any other family members having similar symptoms.  Also reports having body aches and fatigue, flulike symptoms at that time. He feels his symptoms today are separate/distinct from the symptoms he had last week. He denies a known history of bowel obstruction.  Patient is a former smoker who states he quit 13 years ago.  Denies regular alcohol or other drug use.  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  His wife is at the bedside.    ROS: Review of Systems  Constitutional:  Negative for chills and fever.  HENT: Negative.    Eyes: Negative.   Respiratory: Negative.    Cardiovascular: Negative.   Gastrointestinal:  Positive for abdominal pain, nausea and vomiting. Negative for blood in stool, diarrhea and melena.  Genitourinary: Negative.   Musculoskeletal:  Positive for back pain.  Skin: Negative.   Neurological: Negative.   Endo/Heme/Allergies: Negative.   Psychiatric/Behavioral: Negative.     All other systems reviewed and are negative.   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 His smoking use included cigarettes. He has a 5.50 pack-year smoking history. He has never used smokeless tobacco. He reports that he does not currently use alcohol. He reports that he does not currently use drugs after having used the following drugs: Marijuana.  Allergies: No Known Allergies  (Not in a hospital admission)    Physical Exam: Blood pressure 121/74, pulse (!) 52, temperature 98.6 F (37 C), resp. rate 18, SpO2 94 %. General: Pleasant male sitting on hospital bed, appears stated age, NAD. HEENT: head -normocephalic, atraumatic; Eyes: PERRLA, no conjunctival injection;anicteric sclerae Neck- Trachea is midline, no thyromegaly or JVD  appreciated.  CV- RRR, no lower extremity edema Pulm- breathing is non-labored ORA Abd- soft, NT/ND, appropriate bowel sounds in 4 quadrants, no masses, hernias, or organomegaly. Previous surgical scar appears well healing  NG in R nare draining clear/bile-stained effluent GU- deferred  MSK- UE/LE symmetrical, no cyanosis, clubbing, or edema. Neuro- non-focal, moving all extremities, normal gait Psych- Alert and Oriented x3 with appropriate affect Skin: warm and dry, no rashes or  lesions   Results for orders placed or performed during the hospital encounter of 10/23/21 (from the past 48 hour(s))  CBC with Differential     Status: Abnormal   Collection Time: 10/23/21 12:47 AM  Result Value Ref Range   WBC 12.0 (H) 4.0 - 10.5 K/uL   RBC 4.63 4.22 - 5.81 MIL/uL   Hemoglobin 14.1 13.0 - 17.0 g/dL   HCT 88.8 91.6 - 94.5 %   MCV 87.5 80.0 - 100.0 fL   MCH 30.5 26.0 - 34.0 pg   MCHC 34.8 30.0 - 36.0 g/dL   RDW 03.8 88.2 - 80.0 %   Platelets 237 150 - 400 K/uL   nRBC 0.0 0.0 - 0.2 %   Neutrophils Relative % 93 %   Neutro Abs 11.0 (H) 1.7 - 7.7 K/uL   Lymphocytes Relative 5 %   Lymphs Abs 0.6 (L) 0.7 - 4.0 K/uL   Monocytes Relative 2 %   Monocytes Absolute 0.3 0.1 - 1.0 K/uL   Eosinophils Relative 0 %   Eosinophils Absolute 0.0 0.0 - 0.5 K/uL   Basophils Relative 0 %   Basophils Absolute 0.0 0.0 - 0.1 K/uL   Immature Granulocytes 0 %   Abs Immature Granulocytes 0.04 0.00 - 0.07 K/uL    Comment: Performed at [facility], [address] [place], [place] [address]  Comprehensive metabolic panel     Status: Abnormal   Collection Time: 10/23/21 12:47 AM  Result Value Ref Range   Sodium 136 135 - 145 mmol/L   Potassium 3.6 3.5 - 5.1 mmol/L   Chloride 101 98 - 111 mmol/L   CO2 22 22 - 32 mmol/L   Glucose, Bld 198 (H) 70 - 99 mg/dL    Comment: Glucose reference range applies only to samples taken after fasting for at least 8 hours.   BUN 16 6 - 20 mg/dL   Creatinine, Ser 9.15 0.61 - 1.24 mg/dL   Calcium 9.5 8.9 - 05.6 mg/dL   Total Protein 7.8 6.5 - 8.1 g/dL   Albumin 4.6 3.5 - 5.0 g/dL   AST 30 15 - 41 U/L   ALT 30 0 - 44 U/L   Alkaline Phosphatase 55 38 - 126 U/L   Total Bilirubin 0.9 0.3 - 1.2 mg/dL   GFR, Estimated >97 >94 mL/min    Comment: (NOTE) Calculated using the CKD-EPI Creatinine Equation (2021)    Anion gap 13 5 - 15    Comment: Performed at [facility], [address], [place], [place] [address]   Lipase, blood     Status: None   Collection Time: 10/23/21 12:47 AM  Result Value Ref Range   Lipase 22 11 - 51 U/L    Comment: Performed at [facility], [address], [place], [place] [address]   DG Abd Portable 1V  Result Date: 10/23/2021 CLINICAL DATA:  41 year old male enteric tube repositioning. EXAM: PORTABLE ABDOMEN - 1 VIEW COMPARISON:  0511 hours today. FINDINGS: Portable AP view at 0605 hours. Enteric tube terminates in  the stomach, side hole the level of the gastric body. Stable visible bowel gas pattern. Contrast is excreted in the nondilated renal collecting systems. Over penetrated lung bases. IMPRESSION: Enteric tube satisfactory, now terminates in the distal stomach with side hole at the mid gastric body. Electronically Signed   By: Odessa Fleming M.D.   On: 10/23/2021 06:24   DG Abd Portable 1V  Result Date: 10/23/2021 CLINICAL DATA:  41 year old male history of nasogastric tube placement. EXAM: PORTABLE ABDOMEN - 1 VIEW COMPARISON:  03/07/2015. FINDINGS: Nasogastric tube is in position with tip in the proximal stomach and side port at the level of the gastroesophageal junction. No pathologic dilatation of small bowel or colon. Gas and stool are noted throughout the visualized colon. Distal rectum is incompletely imaged. Iodinated contrast material is noted within the collecting systems of both kidneys, presumably from recent contrast enhanced CT examination. IMPRESSION: 1. Nasogastric tube is in a high position and should be advanced approximately 10 cm for more optimal placement. Electronically Signed   By: Trudie Reed M.D.   On: 10/23/2021 05:28   CT Abdomen Pelvis W Contrast  Result Date: 10/23/2021 CLINICAL DATA:  Abdominal pain, acute, nonlocalized EXAM: CT ABDOMEN AND PELVIS WITH CONTRAST TECHNIQUE: Multidetector CT imaging of the abdomen and pelvis was performed using the standard protocol following bolus administration of intravenous contrast. RADIATION  DOSE REDUCTION: This exam was performed according to the departmental dose-optimization program which includes automated exposure control, adjustment of the mA and/or kV according to patient size and/or use of iterative reconstruction technique. CONTRAST:  OMNIPAQUE IOHEXOL 300 MG/ML  SOLN COMPARISON:  01/08/2016 FINDINGS: Lower chest: No acute abnormality Hepatobiliary: No suspicious focal hepatic abnormality. No biliary ductal dilatation. Gallbladder unremarkable. Pancreas: No focal abnormality or ductal dilatation. Spleen: No focal abnormality.  Normal size. Adrenals/Urinary Tract: No adrenal abnormality. No focal renal abnormality. No stones or hydronephrosis. Urinary bladder is unremarkable. Stomach/Bowel: Evidence of malrotation. The cecum is to the left of midline and the ascending colon is midline. Mildly prominent central small bowel loops. There appears to be fecalization of the small bowel loops. There is also mild gaseous distention of the cecum and ascending colon. There appears to be wall thickening within the distal ascending colon. This could reflect colitis. Stomach and proximal small bowel decompressed. Vascular/Lymphatic: No evidence of aneurysm or adenopathy. Reproductive: No visible focal abnormality. Other: No free fluid or free air. Musculoskeletal: No acute bony abnormality. IMPRESSION: Evidence of malrotation. Cecum is left of midline and ascending colon is midline. There is an area of wall thickening in the distal ascending colon which could reflect colitis. This may be causing mild functional obstruction as the right colon proximal to this is dilated as are distal small bowel loops which are fecalized. Electronically Signed   By: Charlett Nose M.D.   On: 10/23/2021 03:49      Assessment/Plan Possible bowel obstruction Possible enterocolitis This is a pleasant 41 year old male with a history of intestinal malrotation who presented to the emergency department with cramping  abdominal pain, nausea, and vomiting.  He is afebrile, has normal vitals, and a mild leukocytosis of 12,000.  CT scan of the abdomen and pelvis was consistent with a history of malrotation status post surgery including appendectomy.  Scan also showed mildly dilated central small bowel loops with some fecalization.  Also showed mild wall thickening and dilation of the cecum.  His abdominal exam is benign and his pain is improving status post NG tube placement.  Based  on these findings and the patient's history of chronic, cramping abdominal pain, I do suspect he has some level of chronic partial small bowel obstruction or functional obstruction.  Given reported history of diarrhea and vomiting last week he may also be recovering from enterocolitis -his body aches, fatigue, pain, and diarrhea have resolved.  I see no emergent indications for surgery.  I recommend decompression with NG tube and IVF replacement for nausea/vomiting.  Will order small bowel obstruction protocol.  I do not think he needs antibiotics at this time for suspected recent enteritis.  If bowel function returns could consider stool studies.  FEN -NPO, IVF VTE -SCDs, Lovenox ID -none Admit -CCS service  I reviewed nursing notes, ED provider notes, last 24 h vitals and pain scores, last 48 h intake and output, last 24 h labs and trends, and last 24 h imaging results.  Adam Phenix, [facility] 10/23/2021, 9:39 AM Please see Amion for pager number during day hours 7:00am-4:30pm or 7:00am -11:30am on weekends

## 2021-10-23 NOTE — ED Notes (Signed)
Pt. Refused Lovenox, Pt. Educated and still refused. Provider made aware.

## 2021-10-24 ENCOUNTER — Inpatient Hospital Stay (HOSPITAL_COMMUNITY): Payer: Managed Care, Other (non HMO)

## 2021-10-24 LAB — BASIC METABOLIC PANEL
Anion gap: 8 (ref 5–15)
BUN: 15 mg/dL (ref 6–20)
CO2: 26 mmol/L (ref 22–32)
Calcium: 8.9 mg/dL (ref 8.9–10.3)
Chloride: 107 mmol/L (ref 98–111)
Creatinine, Ser: 0.8 mg/dL (ref 0.61–1.24)
GFR, Estimated: >60 mL/min (ref >60)
Glucose, Bld: 97 mg/dL (ref 70–99)
Potassium: 4 mmol/L (ref 3.5–5.1)
Sodium: 141 mmol/L (ref 135–145)

## 2021-10-24 LAB — CBC
HCT: 39.1 % (ref 39.0–52.0)
Hemoglobin: 12.9 g/dL — ABNORMAL LOW (ref 13.0–17.0)
MCH: 29.9 pg (ref 26.0–34.0)
MCHC: 33 g/dL (ref 30.0–36.0)
MCV: 90.7 fL (ref 80.0–100.0)
Platelets: 216 10*3/uL (ref 150–400)
RBC: 4.31 MIL/uL (ref 4.22–5.81)
RDW: 12.5 % (ref 11.5–15.5)
WBC: 9.3 10*3/uL (ref 4.0–10.5)
nRBC: 0 % (ref 0.0–0.2)

## 2021-10-24 LAB — HIV ANTIBODY (ROUTINE TESTING W REFLEX): HIV Screen 4th Generation wRfx: NONREACTIVE

## 2021-10-24 NOTE — Progress Notes (Signed)
Transition of Care [facility]) Screening Note  Patient Details  Name: NAHMIR ZEIDMAN Date of Birth: [birth year]  Transition of Care [facility]) CM/SW Contact:    Ewing Schlein, LCSW Phone Number: 10/24/2021, 10:02 AM  Transition of Care Department [facility]) has reviewed patient and no TOC needs have been identified at this time. We will continue to monitor patient advancement through interdisciplinary progression rounds. If new patient transition needs arise, please place a TOC consult.

## 2021-10-24 NOTE — Progress Notes (Signed)
[facility] Progress Note     Subjective: CC:  Reports some recurrent of abd pain last night that has now resolved. Has been mobilizing. Denies flatus or BM yet.  Objective: Vital signs in last 24 hours: Temp:  [97.8 F (36.6 C)-99 F (37.2 C)] 99 F (37.2 C) (07/13 0543) Pulse Rate:  [53-75] 63 (07/13 0543) Resp:  [15-18] 18 (07/13 0543) BP: (113-132)/(73-90) 117/73 (07/13 0543) SpO2:  [97 %-100 %] 97 % (07/13 0543)    Intake/Output from previous day: 07/12 0701 - 07/13 0700 In: 1735.6 [I.V.:1705.6; NG/GT:30] Out: 750 [Emesis/NG output:750] Intake/Output this shift: No intake/output data recorded.  PE: Gen:  Alert, NAD, pleasant Card:  Regular rate and rhythm, pedal pulses 2+ BL Pulm:  Normal effort, clear to auscultation bilaterally Abd: Soft, mild fullness and tenderness over central lower abdomen but otherwise non-distended and non-tender, no rebound tenderness, previous surgical scars appear well-healing.  Skin: warm and dry, no rashes  Psych: A&Ox3   Lab Results:  Recent Labs    10/23/21 0047 10/24/21 0444  WBC 12.0* 9.3  HGB 14.1 12.9*  HCT 40.5 39.1  PLT 237 216   BMET Recent Labs    10/23/21 0047 10/24/21 0444  NA 136 141  K 3.6 4.0  CL 101 107  CO2 22 26  GLUCOSE 198* 97  BUN 16 15  CREATININE 0.98 0.80  CALCIUM 9.5 8.9   PT/INR No results for input(s): "LABPROT", "INR" in the last 72 hours. CMP     Component Value Date/Time   NA 141 10/24/2021 0444   K 4.0 10/24/2021 0444   CL 107 10/24/2021 0444   CO2 26 10/24/2021 0444   GLUCOSE 97 10/24/2021 0444   BUN 15 10/24/2021 0444   CREATININE 0.80 10/24/2021 0444   CALCIUM 8.9 10/24/2021 0444   PROT 7.8 10/23/2021 0047   ALBUMIN 4.6 10/23/2021 0047   AST 30 10/23/2021 0047   ALT 30 10/23/2021 0047   ALKPHOS 55 10/23/2021 0047   BILITOT 0.9 10/23/2021 0047   GFRNONAA >60 10/24/2021 0444   GFRAA >60 01/08/2016 1215   Lipase     Component Value Date/Time   LIPASE 22  10/23/2021 0047       Studies/Results: DG Abd Portable 1V  Result Date: 10/24/2021 CLINICAL DATA:  Follow-up small bowel obstruction EXAM: PORTABLE ABDOMEN - 1 VIEW COMPARISON:  Abdominal radiograph dated September 23, 2021 FINDINGS: Enteric tube tip is in the stomach with side port near the GE junction, recommend advancement for optimal positioning. Contrast material has advanced into the large bowel. Air is seen in nondilated loops of small bowel. No acute osseous abnormality. IMPRESSION: 1. Enteric tube tip is in the stomach with side port near the GE junction, recommend advancement for optimal positioning. 2. Contrast material has advanced into the large bowel, likely at the area of the splenic flexure. Electronically Signed   By: Allegra Lai M.D.   On: 10/24/2021 08:39   DG Abd Portable 1V-Small Bowel Obstruction Protocol-initial, 8 hr delay  Result Date: 10/23/2021 CLINICAL DATA:  Small bowel obstruction. EXAM: PORTABLE ABDOMEN - 1 VIEW COMPARISON:  October 23, 2021 FINDINGS: Enteric catheter within the gastric body. Or bowel contrast has been given. Oral contrast opacifies most of the small bowel which is normal in caliber. No convincing advance of contrast into the colon is seen. IMPRESSION: Oral contrast opacifies most of the small bowel which is normal in caliber. No convincing advance of contrast into the colon. Electronically Signed  By: Ted Mcalpine M.D.   On: 10/23/2021 18:19   DG Abd Portable 1V  Result Date: 10/23/2021 CLINICAL DATA:  41 year old male enteric tube repositioning. EXAM: PORTABLE ABDOMEN - 1 VIEW COMPARISON:  0511 hours today. FINDINGS: Portable AP view at 0605 hours. Enteric tube terminates in the stomach, side hole the level of the gastric body. Stable visible bowel gas pattern. Contrast is excreted in the nondilated renal collecting systems. Over penetrated lung bases. IMPRESSION: Enteric tube satisfactory, now terminates in the distal stomach with side hole at  the mid gastric body. Electronically Signed   By: Odessa Fleming M.D.   On: 10/23/2021 06:24   DG Abd Portable 1V  Result Date: 10/23/2021 CLINICAL DATA:  41 year old male history of nasogastric tube placement. EXAM: PORTABLE ABDOMEN - 1 VIEW COMPARISON:  03/07/2015. FINDINGS: Nasogastric tube is in position with tip in the proximal stomach and side port at the level of the gastroesophageal junction. No pathologic dilatation of small bowel or colon. Gas and stool are noted throughout the visualized colon. Distal rectum is incompletely imaged. Iodinated contrast material is noted within the collecting systems of both kidneys, presumably from recent contrast enhanced CT examination. IMPRESSION: 1. Nasogastric tube is in a high position and should be advanced approximately 10 cm for more optimal placement. Electronically Signed   By: Trudie Reed M.D.   On: 10/23/2021 05:28   CT Abdomen Pelvis W Contrast  Result Date: 10/23/2021 CLINICAL DATA:  Abdominal pain, acute, nonlocalized EXAM: CT ABDOMEN AND PELVIS WITH CONTRAST TECHNIQUE: Multidetector CT imaging of the abdomen and pelvis was performed using the standard protocol following bolus administration of intravenous contrast. RADIATION DOSE REDUCTION: This exam was performed according to the departmental dose-optimization program which includes automated exposure control, adjustment of the mA and/or kV according to patient size and/or use of iterative reconstruction technique. CONTRAST:  OMNIPAQUE IOHEXOL 300 MG/ML  SOLN COMPARISON:  01/08/2016 FINDINGS: Lower chest: No acute abnormality Hepatobiliary: No suspicious focal hepatic abnormality. No biliary ductal dilatation. Gallbladder unremarkable. Pancreas: No focal abnormality or ductal dilatation. Spleen: No focal abnormality.  Normal size. Adrenals/Urinary Tract: No adrenal abnormality. No focal renal abnormality. No stones or hydronephrosis. Urinary bladder is unremarkable. Stomach/Bowel: Evidence of  malrotation. The cecum is to the left of midline and the ascending colon is midline. Mildly prominent central small bowel loops. There appears to be fecalization of the small bowel loops. There is also mild gaseous distention of the cecum and ascending colon. There appears to be wall thickening within the distal ascending colon. This could reflect colitis. Stomach and proximal small bowel decompressed. Vascular/Lymphatic: No evidence of aneurysm or adenopathy. Reproductive: No visible focal abnormality. Other: No free fluid or free air. Musculoskeletal: No acute bony abnormality. IMPRESSION: Evidence of malrotation. Cecum is left of midline and ascending colon is midline. There is an area of wall thickening in the distal ascending colon which could reflect colitis. This may be causing mild functional obstruction as the right colon proximal to this is dilated as are distal small bowel loops which are fecalized. Electronically Signed   By: Charlett Nose M.D.   On: 10/23/2021 03:49    Anti-infectives: Anti-infectives (From admission, onward)    None        Assessment/Plan Possible pSBO Possible enterocolitis  -  AFVSS, leukocytosis resolved  - KUB this AM with contrast in the colon, his pain has resolved  - clamp trial of NG and await bowel function, likely D/C NG at 1300 and start  liquid diet     LOS: 1 day   I reviewed nursing notes, last 24 h vitals and pain scores, last 48 h intake and output, last 24 h labs and trends, and last 24 h imaging results.    Hosie Spangle, PA-C [facility] Please see Amion for pager number during day hours 7:00am-4:30pm

## 2021-10-25 NOTE — Plan of Care (Signed)
Reviewed written d/c instructions w pt and all questions answered. He verbalized understanding. D/C ambulatory w all belongings in stable condition 

## 2021-10-25 NOTE — Discharge Summary (Signed)
[facility] Discharge Summary   Patient ID: Larry White MRN: [id number] DOB/AGE: 10/24/1980 41 y.o.  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 Contrast material has advanced into the large bowel. Air is seen in nondilated loops of small bowel. No acute osseous abnormality. IMPRESSION: 1. Enteric tube tip is in the stomach with side port near the GE junction, recommend advancement for optimal positioning. 2. Contrast material has advanced into the large bowel, likely at the area of the splenic flexure. Electronically Signed   By: Yetta Glassman M.D.   On: 10/24/2021 08:39   DG Abd Portable 1V-Small Bowel Obstruction Protocol-initial, 8 hr delay  Result Date: 10/23/2021 CLINICAL DATA:  Small bowel obstruction. EXAM: PORTABLE ABDOMEN - 1 VIEW COMPARISON:  October 23, 2021 FINDINGS: Enteric catheter within the gastric body. Or bowel contrast has been given. Oral contrast opacifies most of the small bowel which is normal in caliber. No convincing advance of contrast into the colon is seen. IMPRESSION: Oral contrast  opacifies most of the small bowel which is normal in caliber. No convincing advance of contrast into the colon. Electronically Signed   By: Fidela Salisbury M.D.   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  Workup showed possible small bowel obstruction and some wall thickening of  the ascending colon.  Patient was admitted and underwent SBO protocol. Contrast progressed to his colon and his bowel function returned. On 10/25/21 his pain was controlled, tolerating PO, having flatus and BM, and felt stable for discharge. He was given a referral to GI for colonoscopy as an outpatient to further evaluate cecal abnormality.  Physical Exam: General:  Alert, NAD, pleasant, comfortable Abd:  Soft, ND, NT, previous scars appear well healing    Allergies as of 10/25/2021   No Known Allergies      Medication List     TAKE these medications    dicyclomine 10 MG capsule Commonly known as: BENTYL Take 10 mg by mouth daily as needed (abdominal cramping).           Signed: Obie Dredge, [facility] 10/25/2021, 1:42 PM

## 2021-10-31 ENCOUNTER — Ambulatory Visit: Payer: Managed Care, Other (non HMO) | Admitting: Nurse Practitioner

## 2022-02-20 LAB — HM COLONOSCOPY

## 2022-04-05 ENCOUNTER — Encounter: Payer: Self-pay | Admitting: Emergency Medicine

## 2022-04-05 ENCOUNTER — Ambulatory Visit
Admission: EM | Admit: 2022-04-05 | Discharge: 2022-04-05 | Disposition: A | Discharge status: Home / Self Care | Payer: Managed Care, Other (non HMO) | Attending: Family Medicine | Admitting: Family Medicine

## 2022-04-05 DIAGNOSIS — W57XXXA Bitten or stung by nonvenomous insect and other nonvenomous arthropods, initial encounter: Secondary | ICD-10-CM

## 2022-04-05 DIAGNOSIS — S70361A Insect bite (nonvenomous), right thigh, initial encounter: Secondary | ICD-10-CM | POA: Diagnosis not present

## 2022-04-05 DIAGNOSIS — L089 Local infection of the skin and subcutaneous tissue, unspecified: Secondary | ICD-10-CM

## 2022-04-05 DIAGNOSIS — T148XXA Other injury of unspecified body region, initial encounter: Secondary | ICD-10-CM

## 2022-04-05 MED ORDER — DOXYCYCLINE HYCLATE 100 MG PO CAPS: 100.0000 mg | ORAL_CAPSULE | Freq: Two times a day (BID) | ORAL | 0 refills | Status: DC

## 2022-04-05 NOTE — Discharge Instructions (Signed)
Take doxycycline 2 times a day.  It is important to take this antibiotic with food I expect this will resolve with treatment.  See your doctor if it fails to improve over the next 7 to 10 days

## 2022-04-05 NOTE — ED Provider Notes (Signed)
[facility]    CSN: [id number] Arrival date & time: 04/05/22  1420      History   Chief Complaint Chief Complaint  Patient presents with   Insect Bite    HPI Larry White is a 41 y.o. male.   HPI  Patient states that [place] 2 [place].  He did not encounter any insects or animals that would have resulted in a bite.  In any event, he itching on the back of his right leg.  Off and on.  He states that it felt uncomfortable.  He did looked and did not see anything that looked like a lesion or bite.  Recently he has noticed a bump on the back of his leg.  He had his wife look at it today.  It was black.  She wanted him to have it checked.  It is tender to touch.  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 Authorizing Provider  doxycycline (VIBRAMYCIN) 100 MG capsule Take 1 capsule (100 mg total) by mouth 2 (two) times daily. 04/05/22  Yes Raylene Everts, MD  dicyclomine (BENTYL) 10 MG capsule Take 10 mg by mouth  daily as needed (abdominal cramping). 10/22/21   [provider]    Family History Family History  Problem Relation Age of Onset   Hyperlipidemia Mother    Hypertension Mother    Diabetes Father    Hyperlipidemia Maternal Grandmother    Heart disease Maternal Grandmother        A fib and CHF   Heart disease Paternal Grandfather     Social History Social History   Tobacco Use   Smoking status: Former    Packs/day: 0.50    Years: 11.00    Total pack years: 5.50    Types: Cigarettes    Quit date: 2009    Years since quitting: 14.9   Smokeless tobacco: Never  Vaping Use   Vaping Use: Never used  Substance Use Topics   Alcohol use: Not Currently    Comment: occ beer   Drug use: Not Currently    Types: Marijuana     Allergies   Patient has no known allergies.   Review of Systems Review of Systems See HPI  Physical Exam Triage Vital Signs ED Triage Vitals  Enc Vitals Group     BP 04/05/22 1455 (!) 145/97     Pulse Rate 04/05/22 1455 85  Resp 04/05/22 1455 14     Temp 04/05/22 1455 99.8 F (37.7 C)     Temp Source 04/05/22 1455 Oral     SpO2 04/05/22 1455 98 %     Weight 04/05/22 1457 195 lb (88.5 kg)     Height 04/05/22 1457 _0  (1.803 m)     Head Circumference --      Peak Flow --      Pain Score 04/05/22 1457 1     Pain Loc --      Pain Edu? --      Excl. in [place]? --    No data found.  Updated Vital Signs BP (!) 145/97 (BP Location: Right Arm)   Pulse 85   Temp 99.8 F (37.7 C) (Oral)   Resp 14   Ht _1  (1.803 m)   Wt 88.5 kg   SpO2 98%   BMI 27.20 kg/m        Physical Exam Constitutional:      General: He is not in acute distress.    Appearance: He is well-developed.  HENT:     Head: Normocephalic and atraumatic.  Eyes:     Conjunctiva/sclera: Conjunctivae normal.     Pupils: Pupils are equal, round, and reactive to light.  Cardiovascular:     Rate and Rhythm: Normal rate.  Pulmonary:     Effort: Pulmonary effort  is normal. No respiratory distress.  Abdominal:     General: There is no distension.     Palpations: Abdomen is soft.  Musculoskeletal:        General: Normal range of motion.     Cervical back: Normal range of motion.  Skin:    General: Skin is warm and dry.     Findings: Lesion present.     Comments: As can be seen in the photograph this area is just above the right popliteal fossa.  There is faint erythema a couple centimeters around the area.  There are 2 lesions with a dark eschar.  They are tender to touch.  There are vesicles forming around the periphery  Neurological:     Mental Status: He is alert.      UC Treatments / Results  Labs (all labs ordered are listed, but only abnormal results are displayed) Labs Reviewed - No data to display  EKG   Radiology No results found.  Procedures Procedures (including critical care time)  Medications Ordered in UC Medications - No data to display  Initial Impression / Assessment and Plan / UC Course  I have reviewed the triage vital signs and the nursing notes.  Pertinent labs & imaging results that were available during my care of the patient were reviewed by me and considered in my medical decision making (see chart for details).     It appears he is developing an infection and an open wound that may have been an insect bite.  Unclear etiology.  Will cover for MRSA with the doxycycline.  Follow-up with primary care Final Clinical Impressions(s) / UC Diagnoses   Final diagnoses:  Insect bite of right thigh, initial encounter  Wound infection     Discharge Instructions      Take doxycycline 2 times a day.  It is important to take this antibiotic with food I expect this will resolve with treatment.  See your doctor if it fails to improve over the next 7 to 10 days     ED Prescriptions     Medication Sig  Dispense Auth. Provider   doxycycline (VIBRAMYCIN) 100 MG capsule Take 1 capsule (100 mg total) by mouth 2  (two) times daily. 20 capsule Raylene Everts, MD      PDMP not reviewed this encounter.   Raylene Everts, MD 04/05/22 [phone]

## 2022-04-05 NOTE — ED Triage Notes (Signed)
Insect bite after being out in the woods on 03/25/22 Noted itching - went away  Pt noticed itching again  3 days ago  Wife noticed a black area  from the bite on the back of his right thigh  Denies fever No allergy meds

## 2022-05-23 ENCOUNTER — Emergency Department (HOSPITAL_BASED_OUTPATIENT_CLINIC_OR_DEPARTMENT_OTHER)
Admission: EM | Admit: 2022-05-23 | Discharge: 2022-05-23 | Disposition: A | Discharge status: Home / Self Care | Payer: Managed Care, Other (non HMO) | Attending: Emergency Medicine | Admitting: Emergency Medicine

## 2022-05-23 ENCOUNTER — Other Ambulatory Visit: Payer: Self-pay

## 2022-05-23 ENCOUNTER — Encounter (HOSPITAL_BASED_OUTPATIENT_CLINIC_OR_DEPARTMENT_OTHER): Payer: Self-pay | Admitting: *Deleted

## 2022-05-23 ENCOUNTER — Emergency Department (HOSPITAL_BASED_OUTPATIENT_CLINIC_OR_DEPARTMENT_OTHER): Payer: Managed Care, Other (non HMO)

## 2022-05-23 DIAGNOSIS — R739 Hyperglycemia, unspecified: Secondary | ICD-10-CM | POA: Insufficient documentation

## 2022-05-23 DIAGNOSIS — R112 Nausea with vomiting, unspecified: Secondary | ICD-10-CM | POA: Insufficient documentation

## 2022-05-23 DIAGNOSIS — D72829 Elevated white blood cell count, unspecified: Secondary | ICD-10-CM | POA: Diagnosis not present

## 2022-05-23 DIAGNOSIS — R1084 Generalized abdominal pain: Secondary | ICD-10-CM | POA: Diagnosis not present

## 2022-05-23 HISTORY — DX: Congenital malformations of intestinal fixation: Q43.3

## 2022-05-23 LAB — CBC WITH DIFFERENTIAL/PLATELET
Abs Immature Granulocytes: 0.04 10*3/uL (ref 0.00–0.07)
Basophils Absolute: 0 10*3/uL (ref 0.0–0.1)
Basophils Relative: 0 %
Eosinophils Absolute: 0 10*3/uL (ref 0.0–0.5)
Eosinophils Relative: 0 %
HCT: 43.1 % (ref 39.0–52.0)
Hemoglobin: 15 g/dL (ref 13.0–17.0)
Immature Granulocytes: 0 %
Lymphocytes Relative: 5 %
Lymphs Abs: 0.8 10*3/uL (ref 0.7–4.0)
MCH: 30.2 pg (ref 26.0–34.0)
MCHC: 34.8 g/dL (ref 30.0–36.0)
MCV: 86.7 fL (ref 80.0–100.0)
Monocytes Absolute: 0.5 10*3/uL (ref 0.1–1.0)
Monocytes Relative: 4 %
Neutro Abs: 13.6 10*3/uL — ABNORMAL HIGH (ref 1.7–7.7)
Neutrophils Relative %: 91 %
Platelets: 220 10*3/uL (ref 150–400)
RBC: 4.97 MIL/uL (ref 4.22–5.81)
RDW: 11.8 % (ref 11.5–15.5)
WBC: 15 10*3/uL — ABNORMAL HIGH (ref 4.0–10.5)
nRBC: 0 % (ref 0.0–0.2)

## 2022-05-23 LAB — COMPREHENSIVE METABOLIC PANEL
ALT: 37 U/L (ref 0–44)
AST: 26 U/L (ref 15–41)
Albumin: 5.3 g/dL — ABNORMAL HIGH (ref 3.5–5.0)
Alkaline Phosphatase: 52 U/L (ref 38–126)
Anion gap: 15 (ref 5–15)
BUN: 14 mg/dL (ref 6–20)
CO2: 24 mmol/L (ref 22–32)
Calcium: 10.8 mg/dL — ABNORMAL HIGH (ref 8.9–10.3)
Chloride: 100 mmol/L (ref 98–111)
Creatinine, Ser: 1.04 mg/dL (ref 0.61–1.24)
GFR, Estimated: >60 mL/min (ref >60)
Glucose, Bld: 168 mg/dL — ABNORMAL HIGH (ref 70–99)
Potassium: 3.5 mmol/L (ref 3.5–5.1)
Sodium: 139 mmol/L (ref 135–145)
Total Bilirubin: 0.7 mg/dL (ref 0.3–1.2)
Total Protein: 8.4 g/dL — ABNORMAL HIGH (ref 6.5–8.1)

## 2022-05-23 LAB — LIPASE, BLOOD: Lipase: <5 U/L — ABNORMAL LOW (ref 11–51)

## 2022-05-23 MED ORDER — KETOROLAC TROMETHAMINE 30 MG/ML IJ SOLN
30.0000 mg | Freq: Once | INTRAMUSCULAR | Status: AC
  Administered 2022-05-23: 30 mg via INTRAVENOUS
  Filled 2022-05-23: qty 1

## 2022-05-23 MED ORDER — DROPERIDOL 2.5 MG/ML IJ SOLN
2.5000 mg | Freq: Once | INTRAMUSCULAR | Status: DC
  Filled 2022-05-23: qty 2

## 2022-05-23 MED ORDER — FENTANYL CITRATE PF 50 MCG/ML IJ SOSY
PREFILLED_SYRINGE | INTRAMUSCULAR | Status: AC
  Filled 2022-05-23: qty 1

## 2022-05-23 MED ORDER — ONDANSETRON HCL 4 MG/2ML IJ SOLN
INTRAMUSCULAR | Status: AC
  Filled 2022-05-23: qty 2

## 2022-05-23 MED ORDER — SODIUM CHLORIDE 0.9 % IV BOLUS
1000.0000 mL | Freq: Once | INTRAVENOUS | Status: AC
  Administered 2022-05-23: 1000 mL via INTRAVENOUS

## 2022-05-23 MED ORDER — HYDROMORPHONE HCL 1 MG/ML IJ SOLN
1.0000 mg | Freq: Once | INTRAMUSCULAR | Status: AC
  Administered 2022-05-23: 1 mg via INTRAVENOUS
  Filled 2022-05-23: qty 1

## 2022-05-23 MED ORDER — FENTANYL CITRATE PF 50 MCG/ML IJ SOSY
50.0000 ug | PREFILLED_SYRINGE | Freq: Once | INTRAMUSCULAR | Status: AC
  Administered 2022-05-23: 50 ug via INTRAVENOUS

## 2022-05-23 MED ORDER — ONDANSETRON HCL 4 MG/2ML IJ SOLN
4.0000 mg | Freq: Once | INTRAMUSCULAR | Status: AC
  Administered 2022-05-23: 4 mg via INTRAVENOUS

## 2022-05-23 MED ORDER — DROPERIDOL 2.5 MG/ML IJ SOLN
1.2500 mg | Freq: Once | INTRAMUSCULAR | Status: AC
  Administered 2022-05-23: 1.25 mg via INTRAVENOUS
  Filled 2022-05-23: qty 2

## 2022-05-23 MED ORDER — ONDANSETRON 4 MG PO TBDP: 4.0000 mg | ORAL_TABLET | Freq: Three times a day (TID) | ORAL | 0 refills | Status: DC | PRN

## 2022-05-23 MED ORDER — IOHEXOL 300 MG/ML  SOLN
100.0000 mL | Freq: Once | INTRAMUSCULAR | Status: AC | PRN
  Administered 2022-05-23: 80 mL via INTRAVENOUS

## 2022-05-23 NOTE — ED Triage Notes (Signed)
BIB family from home for abd pain, also NV, emesis is green, h/o malrotation, feels similar, denies fever, bleeding, diarrhea, constipation. Last BM this am (normal), last ate 0730. Doubled over. Active nausea. Rates 8/10.

## 2022-05-23 NOTE — ED Notes (Signed)
Patient vomiting large amount of bile emesis

## 2022-05-23 NOTE — ED Provider Notes (Signed)
[facility] Provider Note   CSN: [id number] Arrival date & time: 05/23/22  1258     History  Chief Complaint  Patient presents with   Abdominal Pain    Larry White is a 42 y.o. male.  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  He states this began at approximately 9:00 this morning.  States it feels exactly the same as when he has had small bowel obstructions in the past.  He has persistent nausea with numerous episodes of emesis.  Denies fevers, melena, hematochezia, diarrhea, constipation.  He had a bowel movement this morning prior to symptom onset.  He last ate approximately 1.5 hours prior to arrival.  He also had coffee with creamer.  He has never used this cream before and states that his lactose-free, but does not specify dairy status.  He typically does not use dairy due to the effects that has on his body.  Currently rates the pain at a 9 out of 10.  It is improved with sitting leaning forward.  It is worsened with laying flat on his back.  He denies smoking, recreational drug use, alcohol use.  He states he took 2 Bentyl this morning without improvement of his symptoms.   Abdominal Pain Associated symptoms: nausea and vomiting        Home Medications Prior to Admission medications   Medication Sig Start Date End Date Taking? Authorizing Provider  ondansetron (ZOFRAN-ODT) 4 MG disintegrating tablet Take 1 tablet (4 mg total) by mouth every 8 (eight) hours as needed for nausea or vomiting. 05/23/22  Yes Jaysa Kise, Grafton Folk, PA-C  dicyclomine (BENTYL) 10 MG capsule Take 10 mg by mouth daily as needed (abdominal cramping). 10/22/21   [provider]  doxycycline (VIBRAMYCIN) 100 MG capsule Take 1 capsule (100 mg total) by mouth 2 (two) times daily. 04/05/22   Raylene Everts, MD      Allergies    Patient has no  known allergies.    Review of Systems   Review of Systems  Gastrointestinal:  Positive for abdominal pain, nausea and vomiting.  All other systems reviewed and are negative.   Physical Exam Updated Vital Signs BP (!) 142/81   Pulse 65   Temp 98.3 F (36.8 C) (Oral)   Resp 14   Wt 88.5 kg   SpO2 98%   BMI 27.20 kg/m  Physical Exam Vitals and nursing note reviewed.  Constitutional:      General: He is in acute distress (Significant).     Appearance: He is well-developed.  HENT:     Head: Normocephalic and atraumatic.  Eyes:     Conjunctiva/sclera: Conjunctivae normal.  Cardiovascular:     Rate and Rhythm: Normal rate and regular rhythm.     Heart sounds: No murmur heard. Pulmonary:     Effort: Pulmonary effort is normal. No respiratory distress.     Breath sounds: Normal breath sounds. No stridor. No wheezing, rhonchi or rales.  Abdominal:     Comments: Declines abdominal exam due to pain  Musculoskeletal:        General: No swelling.     Cervical back: Neck supple.  Skin:    General: Skin is warm and dry.     Capillary Refill: Capillary refill takes less than 2 seconds.  Neurological:     General: No focal deficit present.  Mental Status: He is alert and oriented to person, place, and time.  Psychiatric:        Mood and Affect: Mood normal.     ED Results / Procedures / Treatments   Labs (all labs ordered are listed, but only abnormal results are displayed) Labs Reviewed  CBC WITH DIFFERENTIAL/PLATELET - Abnormal; Notable for the following components:      Result Value   WBC 15.0 (*)    Neutro Abs 13.6 (*)    All other components within normal limits  COMPREHENSIVE METABOLIC PANEL - Abnormal; Notable for the following components:   Glucose, Bld 168 (*)    Calcium 10.8 (*)    Total Protein 8.4 (*)    Albumin 5.3 (*)    All other components within normal limits  LIPASE, BLOOD - Abnormal; Notable for the following components:   Lipase <5 (*)    All  other components within normal limits  URINALYSIS, ROUTINE W REFLEX MICROSCOPIC    EKG None  Radiology CT ABDOMEN PELVIS W CONTRAST  Result Date: 05/23/2022 CLINICAL DATA:  Abdominal pain with nausea and vomiting. EXAM: CT ABDOMEN AND PELVIS WITH CONTRAST TECHNIQUE: Multidetector CT imaging of the abdomen and pelvis was performed using the standard protocol following bolus administration of intravenous contrast. RADIATION DOSE REDUCTION: This exam was performed according to the departmental dose-optimization program which includes automated exposure control, adjustment of the mA and/or kV according to patient size and/or use of iterative reconstruction technique. CONTRAST:  90m OMNIPAQUE IOHEXOL 300 MG/ML  SOLN COMPARISON:  CT abdomen pelvis dated October 23, 2021. FINDINGS: Lower chest: No acute abnormality. Hepatobiliary: No focal liver abnormality is seen. No gallstones, gallbladder wall thickening, or biliary dilatation. Pancreas: Unremarkable. No pancreatic ductal dilatation or surrounding inflammatory changes. Spleen: Normal in size without focal abnormality. Adrenals/Urinary Tract: Adrenal glands are unremarkable. Kidneys are normal, without renal calculi, focal lesion, or hydronephrosis. Bladder is unremarkable. Stomach/Bowel: Unchanged small hiatal hernia. The stomach is otherwise within normal limits. Similar findings of malrotation with the majority of the small bowel in the right abdomen majority of the colon in the left abdomen. No bowel wall thickening, distention, or surrounding inflammatory changes. Fecalization of the distal ileum. Vascular/Lymphatic: No significant vascular findings are present. No enlarged abdominal or pelvic lymph nodes. Reproductive: Prostate is unremarkable. Other: No abdominal wall hernia or abnormality. No abdominopelvic ascites. No pneumoperitoneum. Musculoskeletal: No acute or significant osseous findings. IMPRESSION: 1. No acute intra-abdominal process. 2. Similar  findings of malrotation. No evidence of obstruction. Electronically Signed   By: WTitus DubinM.D.   On: 05/23/2022 14:42    Procedures Procedures    Medications Ordered in ED Medications  ondansetron (ZOFRAN) injection 4 mg (4 mg Intravenous Given 05/23/22 1336)  fentaNYL (SUBLIMAZE) injection 50 mcg (50 mcg Intravenous Given 05/23/22 1336)  iohexol (OMNIPAQUE) 300 MG/ML solution 100 mL (80 mLs Intravenous Contrast Given 05/23/22 1409)  HYDROmorphone (DILAUDID) injection 1 mg (1 mg Intravenous Given 05/23/22 1422)  sodium chloride 0.9 % bolus 1,000 mL (0 mLs Intravenous Stopped 05/23/22 1721)  droperidol (INAPSINE) 2.5 MG/ML injection 1.25 mg (1.25 mg Intravenous Given 05/23/22 1602)  HYDROmorphone (DILAUDID) injection 1 mg (1 mg Intravenous Given 05/23/22 1626)  ketorolac (TORADOL) 30 MG/ML injection 30 mg (30 mg Intravenous Given 05/23/22 1625)    ED Course/ Medical Decision Making/ A&P Clinical Course as of 05/23/22 1748  Fri May 23, 2022  1516 Spoke with KClaiborne Billingswith general surgery.  She will speak with Dr. LBobbye Morton but does  not believe NG tube is appropriate at this time [AS]  1535 Spoke with [place] with general surgery again.  She spoke with Dr. Wynell Balloon to has no further recommendations.  Patient needs to be admitted for nausea and pain control it should be a hospitalist admission.  NG tube not necessary [AS]  [id number] On reevaluation, patient states that his abdominal pain has completely resolved.  He has no more nausea as well.  Will attempt p.o. challenge.  He would prefer to be discharged home [AS]    Clinical Course User Index [AS] Herschel Fleagle, Grafton Folk, PA-C                             Medical Decision Making Amount and/or Complexity of Data Reviewed Labs: ordered. Radiology: ordered.  Risk Prescription drug management.  This patient presents to the ED for concern of abdominal pain, nausea, vomiting, this involves an extensive number of treatment options, and is a complaint that carries  with it a high risk of complications and morbidity.  The differential diagnosis for generalized abdominal pain includes, but is not limited to AAA, gastroenteritis, appendicitis, Bowel obstruction, Bowel perforation. Gastroparesis, DKA, Hernia, Inflammatory bowel disease, mesenteric ischemia, pancreatitis, peritonitis SBP, volvulus.   Co morbidities that complicate the patient evaluation   volvulus neonatorum requiring surgery as a child, appendectomy  My initial workup includes abdominal pain labs, CT abdomen pelvis  Additional history obtained from: Nursing notes from this visit. Previous records within EMR system ED to hospital admission on 10/23/2021 for similar  I ordered, reviewed and interpreted labs which include: CBC, CMP, lipase, urinalysis.  Leukocytosis of 15, hyperglycemia of 168  I ordered imaging studies including CT abdomen pelvis I independently visualized and interpreted imaging which showed no obstruction.  No acute findings I agree with the radiologist interpretation  Cardiac Monitoring:  The patient was maintained on a cardiac monitor.  I personally viewed and interpreted the cardiac monitored which showed an underlying rhythm of: NSR  Consultations Obtained:  I requested consultation with the general surgery,  and discussed lab and imaging findings as well as pertinent plan - they recommend: Treat symptoms.  No surgical interventions.  No need for admission  Afebrile, hemodynamically stable.  42 year old male presenting to the ED for evaluation of abdominal pain, nausea and vomiting that began at 9:00 this morning.  He states it feels very similar to the bowel obstructions that he has had in the past.  On exam, patient was bent over in pain and would not allow me to palpate or perform an abdominal exam.  He was given a dose of fentanyl and Zofran initially which stopped his vomiting but did not change his nausea or abdominal pain.  He was then given a dose of droperidol  which did not change his symptoms.  He was given another dose of Dilaudid and a dose of Toradol and then reported resolution of his symptoms.  He was resting comfortably in bed after that.  His labs were significant for a leukocytosis of 15.0.  This may be reactive due to lack of fever.  His CT shows no signs of bowel obstruction.  It did show malrotation which was stable from previous imaging.  I consulted with general surgery who recommended treating symptoms.  May admit to hospitalist if symptoms do not improve.  His symptoms eventually resolved.  He was able to eat and drink in the ED.  He states he has enough Bentyl  to last until he is able to follow-up.  Prescription will be sent for Zofran.  He was given return precautions.  Stable at discharge.  At this time there does not appear to be any evidence of an acute emergency medical condition and the patient appears stable for discharge with appropriate outpatient follow up. Diagnosis was discussed with patient who verbalizes understanding of care plan and is agreeable to discharge. I have discussed return precautions with patient and wife who verbalizes understanding. Patient encouraged to follow-up with their PCP within 1 week. All questions answered.  Patient's case discussed with Dr. Ronnald Nian who agrees with plan to discharge with follow-up.   Note: Portions of this report may have been transcribed using voice recognition software. Every effort was made to ensure accuracy; however, inadvertent computerized transcription errors may still be present.        Final Clinical Impression(s) / ED Diagnoses Final diagnoses:  Generalized abdominal pain  Nausea and vomiting, unspecified vomiting type    Rx / DC Orders ED Discharge Orders          Ordered    ondansetron (ZOFRAN-ODT) 4 MG disintegrating tablet  Every 8 hours PRN        05/23/22 1745              Roylene Reason, PA-C 05/23/22 [place], [place], DO 05/23/22  1823

## 2022-05-23 NOTE — Discharge Instructions (Addendum)
You have been seen today for your complaint of abdominal pain, nausea and vomiting. Your lab work was overall reassuring. Your imaging was reassuring and showed no acute abnormalities. Your discharge medications include your home medications. Alternate tylenol and ibuprofen for pain. You may alternate these every 4 hours. You may take up to 800 mg of ibuprofen at a time and up to 1000 mg of tylenol. Follow up with: Your primary care provider in 1 week for reevaluation Please seek immediate medical care if you develop any of the following symptoms: Your pain does not go away as soon as your health care provider told you to expect. You cannot stop vomiting. Your pain is only in areas of the abdomen, such as the right side or the left lower portion of the abdomen. Pain on the right side could be caused by appendicitis. You have bloody or black stools, or stools that look like tar. You have severe pain, cramping, or bloating in your abdomen. You have signs of dehydration, such as: Dark urine, very little urine, or no urine. Cracked lips. Dry mouth. Sunken eyes. Sleepiness. Weakness. You have trouble breathing or chest pain. At this time there does not appear to be the presence of an emergent medical condition, however there is always the potential for conditions to change. Please read and follow the below instructions.  Do not take your medicine if  develop an itchy rash, swelling in your mouth or lips, or difficulty breathing; call 911 and seek immediate emergency medical attention if this occurs.  You may review your lab tests and imaging results in their entirety on your MyChart account.  Please discuss all results of fully with your primary care provider and other specialist at your follow-up visit.  Note: Portions of this text may have been transcribed using voice recognition software. Every effort was made to ensure accuracy; however, inadvertent computerized transcription errors may  still be present.

## 2022-05-26 ENCOUNTER — Telehealth: Payer: Self-pay

## 2022-05-26 NOTE — Transitions of Care (Post Inpatient/ED Visit) (Unsigned)
   05/26/2022  Name: GARO HEIDELBERG MRN: [id number] DOB: 03/29/1981  Today's TOC FU Call Status:    Attempted to reach the patient regarding the most recent Inpatient/ED visit.  Follow Up Plan: Additional outreach attempts will be made to reach the patient to complete the Transitions of Care (Post Inpatient/ED visit) call.   [place] LPN [place] Advisor Direct Dial [phone]

## 2022-05-27 NOTE — Transitions of Care (Post Inpatient/ED Visit) (Signed)
   05/27/2022  Name: Larry White MRN: [id number] DOB: 1981/04/14  Today's TOC FU Call Status: Today's TOC FU Call Status:: Successful TOC FU Call Competed Unsuccessful Call (1st Attempt) Date: 05/26/22 [facility] FU Call Complete Date: 05/27/22  Transition Care Management Follow-up Telephone Call Date of Discharge: 05/23/22 Discharge Facility: [facility] Type of Discharge: Emergency Department Reason for ED Visit:  (generalized abdominal pain) How have you been since you were released from the hospital?: Better Any questions or concerns?: No  Items Reviewed: Did you receive and understand the discharge instructions provided?: Yes Medications obtained and verified?: Yes (Medications Reviewed) Any new allergies since your discharge?: No Dietary orders reviewed?: NA Do you have support at home?: Yes  Home Care and Equipment/Supplies: Mutual Ordered?: NA Any new equipment or medical supplies ordered?: NA  Functional Questionnaire: Do you need assistance with bathing/showering or dressing?: No Do you need assistance with meal preparation?: No Do you need assistance with eating?: No Do you have difficulty maintaining continence: No Do you need assistance with getting out of bed/getting out of a chair/moving?: No Do you have difficulty managing or taking your medications?: No  Folllow up appointments reviewed: PCP Follow-up appointment confirmed?: NA Specialist Hospital Follow-up appointment confirmed?: NA Do you need transportation to your follow-up appointment?: No Do you understand care options if your condition(s) worsen?: Yes-patient verbalized understanding    [place] LPN [place] Direct Dial [phone]

## 2022-08-13 ENCOUNTER — Encounter: Payer: Self-pay | Admitting: Nurse Practitioner

## 2022-08-13 ENCOUNTER — Ambulatory Visit (INDEPENDENT_AMBULATORY_CARE_PROVIDER_SITE_OTHER): Payer: Managed Care, Other (non HMO) | Admitting: Nurse Practitioner

## 2022-08-13 VITALS — BP 116/68 | HR 76 | Temp 99.2°F | Resp 16 | Ht 70.25 in | Wt 201.0 lb

## 2022-08-13 DIAGNOSIS — R7303 Prediabetes: Secondary | ICD-10-CM | POA: Insufficient documentation

## 2022-08-13 DIAGNOSIS — Z0000 Encounter for general adult medical examination without abnormal findings: Secondary | ICD-10-CM

## 2022-08-13 DIAGNOSIS — E663 Overweight: Secondary | ICD-10-CM | POA: Diagnosis not present

## 2022-08-13 NOTE — Assessment & Plan Note (Signed)
Pending labs inclusive of TSH, A1c, and lipids.  Continue working on lifestyle modifications

## 2022-08-13 NOTE — Patient Instructions (Signed)
Nice to see you today I will be in touch with the labs once I have them You can make a a FASTING lab appointment over the next 2 weeks.  I will see you in a year for your next physical, sooner if you need me

## 2022-08-13 NOTE — Progress Notes (Signed)
Established Patient Office Visit  Subjective   Patient ID: Larry White, male    DOB: March 21, 1981  Age: 42 y.o. MRN: [id number]  Chief Complaint  Patient presents with   Annual Exam    HPI  for complete physical and follow up of chronic conditions.  Immunizations: -Tetanus: Completed in 2022 -Influenza: out of season  -Shingles: Too young -Pneumonia: Too young - Covid: pfizer  Diet: Fair diet. States that he is doing 3 meals a day with some snacks. States that he does water and propel. Hot tea and coffee  Exercise: States that he will get out with the kids and play. Walk with the wife. Tennis 2-3 hours a week   Eye exam: needs updating. Glasses only   Dental exam: Completes semi-annually    Colonoscopy: Completed in 02/20/2022 Dr. Moss Mc gastro  Lung Cancer Screening: N/A  PSA: Too young to currently average risk  Sleep: states that he goes to bed around 02-1129 and gets up around 630 and does not feel rested. Does snore      Review of Systems  Constitutional:  Negative for chills and fever.  Respiratory:  Negative for shortness of breath.   Cardiovascular:  Negative for chest pain and leg swelling.  Gastrointestinal:  Negative for abdominal pain, blood in stool, constipation, diarrhea, nausea and vomiting.       BM daily  Genitourinary:  Negative for dysuria and hematuria.  Neurological:  Negative for tingling and headaches.  Psychiatric/Behavioral:  Negative for hallucinations and suicidal ideas.       Objective:     BP 116/68   Pulse 76   Temp 99.2 F (37.3 C)   Resp 16   Ht 5' 10.25" (1.784 m)   Wt 201 lb (91.2 kg)   SpO2 98%   BMI 28.64 kg/m  BP Readings from Last 3 Encounters:  08/13/22 116/68  05/23/22 117/79  04/05/22 (!) 145/97   Wt Readings from Last 3 Encounters:  08/13/22 201 lb (91.2 kg)  05/23/22 195 lb (88.5 kg)  04/05/22 195 lb (88.5 kg)      Physical Exam Vitals and nursing note reviewed. Exam conducted with a  chaperone present Tresa Endo Fiscal, CMA).  Constitutional:      Appearance: Normal appearance.  HENT:     Right Ear: Tympanic membrane, ear canal and external ear normal.     Left Ear: Tympanic membrane, ear canal and external ear normal.     Mouth/Throat:     Mouth: Mucous membranes are moist.     Pharynx: Oropharynx is clear.  Eyes:     Extraocular Movements: Extraocular movements intact.     Pupils: Pupils are equal, round, and reactive to light.  Cardiovascular:     Rate and Rhythm: Normal rate and regular rhythm.     Pulses: Normal pulses.     Heart sounds: Normal heart sounds.  Pulmonary:     Effort: Pulmonary effort is normal.     Breath sounds: Normal breath sounds.  Abdominal:     General: Bowel sounds are normal. There is no distension.     Palpations: There is no mass.     Tenderness: There is no abdominal tenderness.     Hernia: No hernia is present. There is no hernia in the left inguinal area or right inguinal area.  Genitourinary:    Penis: Normal.      Testes: Normal.     Epididymis:     Right: Normal.  Left: Normal.  Musculoskeletal:     Right lower leg: No edema.     Left lower leg: No edema.  Lymphadenopathy:     Cervical: No cervical adenopathy.     Lower Body: No right inguinal adenopathy. No left inguinal adenopathy.  Skin:    General: Skin is warm.  Neurological:     General: No focal deficit present.     Mental Status: He is alert.     Deep Tendon Reflexes:     Reflex Scores:      Bicep reflexes are 2+ on the right side and 2+ on the left side.      Patellar reflexes are 2+ on the right side and 2+ on the left side.    Comments: Bilateral upper and lower extremity strength 5/5  Psychiatric:        Mood and Affect: Mood normal.        Behavior: Behavior normal.        Thought Content: Thought content normal.        Judgment: Judgment normal.      No results found for any visits on 08/13/22.    The 10-year ASCVD risk score (Arnett DK,  et al., 2019) is: 1.1%    Assessment & Plan:   Problem List Items Addressed This Visit       Other   Preventative health care - Primary    Discussed age-appropriate immunizations and screening exams.  Patient is up-to-date on all age-appropriate immunizations.  Patient is too young for CRC screening but has underwent a colonoscopy in 2023.  Too young for prostate cancer screening.  Did review patient's personal, surgical, social, family histories.  Patient was given information at discharge about preventative healthcare maintenance with anticipatory guidance.      Relevant Orders   CBC   Comprehensive metabolic panel   TSH   Prediabetes    Pending labs inclusive of TSH, A1c, and lipids.  Continue working on lifestyle modifications      Relevant Orders   Hemoglobin A1c   TSH   Lipid panel   Overweight    Pending labs continue working on lifestyle modifications.      Relevant Orders   TSH    Return in about 1 year (around 08/13/2023) for CPE and Labs.    Audria Nine, NP

## 2022-08-13 NOTE — Assessment & Plan Note (Signed)
Discussed age-appropriate immunizations and screening exams.  Patient is up-to-date on all age-appropriate immunizations.  Patient is too young for CRC screening but has underwent a colonoscopy in 2023.  Too young for prostate cancer screening.  Did review patient's personal, surgical, social, family histories.  Patient was given information at discharge about preventative healthcare maintenance with anticipatory guidance.

## 2022-08-13 NOTE — Assessment & Plan Note (Signed)
Pending labs continue working on lifestyle modifications.

## 2022-08-21 ENCOUNTER — Other Ambulatory Visit (INDEPENDENT_AMBULATORY_CARE_PROVIDER_SITE_OTHER): Payer: Managed Care, Other (non HMO)

## 2022-08-21 DIAGNOSIS — Z0000 Encounter for general adult medical examination without abnormal findings: Secondary | ICD-10-CM

## 2022-08-21 DIAGNOSIS — E663 Overweight: Secondary | ICD-10-CM

## 2022-08-21 DIAGNOSIS — R7303 Prediabetes: Secondary | ICD-10-CM

## 2022-08-21 LAB — CBC
HCT: 43.2 % (ref 39.0–52.0)
Hemoglobin: 14.8 g/dL (ref 13.0–17.0)
MCHC: 34.2 g/dL (ref 30.0–36.0)
MCV: 89.8 fl (ref 78.0–100.0)
Platelets: 215 10*3/uL (ref 150.0–400.0)
RBC: 4.81 Mil/uL (ref 4.22–5.81)
RDW: 12.5 % (ref 11.5–15.5)
WBC: 6.2 10*3/uL (ref 4.0–10.5)

## 2022-08-21 LAB — COMPREHENSIVE METABOLIC PANEL
ALT: 40 U/L (ref 0–53)
AST: 24 U/L (ref 0–37)
Albumin: 4.7 g/dL (ref 3.5–5.2)
Alkaline Phosphatase: 62 U/L (ref 39–117)
BUN: 17 mg/dL (ref 6–23)
CO2: 30 mEq/L (ref 19–32)
Calcium: 9.8 mg/dL (ref 8.4–10.5)
Chloride: 101 mEq/L (ref 96–112)
Creatinine, Ser: 0.99 mg/dL (ref 0.40–1.50)
GFR: 94.47 mL/min (ref >60.00)
Glucose, Bld: 86 mg/dL (ref 70–99)
Potassium: 4.3 mEq/L (ref 3.5–5.1)
Sodium: 141 mEq/L (ref 135–145)
Total Bilirubin: 0.7 mg/dL (ref 0.2–1.2)
Total Protein: 7.5 g/dL (ref 6.0–8.3)

## 2022-08-21 LAB — TSH: TSH: 1.59 u[IU]/mL (ref 0.35–5.50)

## 2022-08-21 LAB — LIPID PANEL
Cholesterol: 165 mg/dL (ref 0–200)
HDL: 50.2 mg/dL (ref >39.00)
LDL Cholesterol: 102 mg/dL — ABNORMAL HIGH (ref 0–99)
NonHDL: 115.15
Total CHOL/HDL Ratio: 3
Triglycerides: 66 mg/dL (ref 0.0–149.0)
VLDL: 13.2 mg/dL (ref 0.0–40.0)

## 2022-08-21 LAB — HEMOGLOBIN A1C: Hgb A1c MFr Bld: 5.7 % (ref 4.6–6.5)

## 2022-09-16 ENCOUNTER — Telehealth: Payer: Self-pay | Admitting: Nurse Practitioner

## 2022-09-16 NOTE — Telephone Encounter (Signed)
Pt called back returning [facility] call regarding results. Pt stated he had already read [facility]'s response regarding results via MyChart. Call back # [phone]

## 2023-06-15 ENCOUNTER — Emergency Department (HOSPITAL_BASED_OUTPATIENT_CLINIC_OR_DEPARTMENT_OTHER)

## 2023-06-15 ENCOUNTER — Observation Stay (HOSPITAL_BASED_OUTPATIENT_CLINIC_OR_DEPARTMENT_OTHER)
Admission: EM | Admit: 2023-06-15 | Discharge: 2023-06-16 | Disposition: A | Discharge status: Home / Self Care | Attending: Internal Medicine | Admitting: Internal Medicine

## 2023-06-15 ENCOUNTER — Other Ambulatory Visit: Payer: Self-pay

## 2023-06-15 ENCOUNTER — Encounter (HOSPITAL_BASED_OUTPATIENT_CLINIC_OR_DEPARTMENT_OTHER): Payer: Self-pay

## 2023-06-15 DIAGNOSIS — R112 Nausea with vomiting, unspecified: Secondary | ICD-10-CM | POA: Diagnosis present

## 2023-06-15 DIAGNOSIS — Z87891 Personal history of nicotine dependence: Secondary | ICD-10-CM | POA: Insufficient documentation

## 2023-06-15 DIAGNOSIS — Z79899 Other long term (current) drug therapy: Secondary | ICD-10-CM | POA: Insufficient documentation

## 2023-06-15 DIAGNOSIS — R1084 Generalized abdominal pain: Secondary | ICD-10-CM

## 2023-06-15 LAB — COMPREHENSIVE METABOLIC PANEL
ALT: 25 U/L (ref 0–44)
AST: 20 U/L (ref 15–41)
Albumin: 5.2 g/dL — ABNORMAL HIGH (ref 3.5–5.0)
Alkaline Phosphatase: 58 U/L (ref 38–126)
Anion gap: 11 (ref 5–15)
BUN: 16 mg/dL (ref 6–20)
CO2: 28 mmol/L (ref 22–32)
Calcium: 9.6 mg/dL (ref 8.9–10.3)
Chloride: 101 mmol/L (ref 98–111)
Creatinine, Ser: 1.23 mg/dL (ref 0.61–1.24)
GFR, Estimated: >60 mL/min (ref >60)
Glucose, Bld: 116 mg/dL — ABNORMAL HIGH (ref 70–99)
Potassium: 4 mmol/L (ref 3.5–5.1)
Sodium: 140 mmol/L (ref 135–145)
Total Bilirubin: 0.4 mg/dL (ref 0.0–1.2)
Total Protein: 7.8 g/dL (ref 6.5–8.1)

## 2023-06-15 LAB — CBC
HCT: 43.6 % (ref 39.0–52.0)
Hemoglobin: 14.6 g/dL (ref 13.0–17.0)
MCH: 29.9 pg (ref 26.0–34.0)
MCHC: 33.5 g/dL (ref 30.0–36.0)
MCV: 89.2 fL (ref 80.0–100.0)
Platelets: 244 10*3/uL (ref 150–400)
RBC: 4.89 MIL/uL (ref 4.22–5.81)
RDW: 11.6 % (ref 11.5–15.5)
WBC: 6.9 10*3/uL (ref 4.0–10.5)
nRBC: 0 % (ref 0.0–0.2)

## 2023-06-15 LAB — URINALYSIS, ROUTINE W REFLEX MICROSCOPIC
Bacteria, UA: NONE SEEN
Bilirubin Urine: NEGATIVE
Glucose, UA: NEGATIVE mg/dL
Hgb urine dipstick: NEGATIVE
Ketones, ur: 15 mg/dL — AB
Leukocytes,Ua: NEGATIVE
Nitrite: NEGATIVE
Protein, ur: 30 mg/dL — AB
Specific Gravity, Urine: >1.046 — ABNORMAL HIGH (ref 1.005–1.030)
pH: 7.5 (ref 5.0–8.0)

## 2023-06-15 LAB — LIPASE, BLOOD: Lipase: <10 U/L — ABNORMAL LOW (ref 11–51)

## 2023-06-15 MED ORDER — IOHEXOL 300 MG/ML  SOLN
100.0000 mL | Freq: Once | INTRAMUSCULAR | Status: AC | PRN
  Administered 2023-06-15: 100 mL via INTRAVENOUS

## 2023-06-15 MED ORDER — DICYCLOMINE HCL 10 MG PO CAPS
10.0000 mg | ORAL_CAPSULE | Freq: Once | ORAL | Status: AC
  Administered 2023-06-15: 10 mg via ORAL
  Filled 2023-06-15: qty 1

## 2023-06-15 MED ORDER — ONDANSETRON 4 MG PO TBDP
4.0000 mg | ORAL_TABLET | Freq: Once | ORAL | Status: AC
  Administered 2023-06-15: 4 mg via ORAL
  Filled 2023-06-15: qty 1

## 2023-06-15 MED ORDER — LACTATED RINGERS IV BOLUS
1000.0000 mL | Freq: Once | INTRAVENOUS | Status: AC
  Administered 2023-06-15: 1000 mL via INTRAVENOUS

## 2023-06-15 MED ORDER — HYDROMORPHONE HCL 1 MG/ML IJ SOLN
1.0000 mg | Freq: Once | INTRAMUSCULAR | Status: AC
  Administered 2023-06-15: 1 mg via INTRAVENOUS
  Filled 2023-06-15: qty 1

## 2023-06-15 MED ORDER — ONDANSETRON 4 MG PO TBDP: 4.0000 mg | ORAL_TABLET | Freq: Once | ORAL | Status: DC

## 2023-06-15 MED ORDER — PROCHLORPERAZINE EDISYLATE 10 MG/2ML IJ SOLN
10.0000 mg | Freq: Once | INTRAMUSCULAR | Status: AC
Start: 2023-06-15 — End: 2023-06-15
  Administered 2023-06-15: 10 mg via INTRAVENOUS
  Filled 2023-06-15: qty 2

## 2023-06-15 MED ORDER — ONDANSETRON HCL 4 MG/2ML IJ SOLN
4.0000 mg | Freq: Once | INTRAMUSCULAR | Status: AC
  Administered 2023-06-15: 4 mg via INTRAVENOUS
  Filled 2023-06-15: qty 2

## 2023-06-15 NOTE — ED Notes (Signed)
 Chicken broth given to pt as PO challenge

## 2023-06-15 NOTE — ED Notes (Signed)
 Pt actively vomiting. RN aware.

## 2023-06-15 NOTE — ED Provider Notes (Signed)
 [place] EMERGENCY DEPARTMENT AT [facility] Provider Note   CSN: [id number] Arrival date & time: 06/15/23  1430     History  Chief Complaint  Patient presents with   Abdominal Pain    Larry White is a 43 y.o. male.  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 This came on suddenly and feels similar to his past SBO. He also became acutely nauseated in triage and is feeling a bit better after some Zofran. His bowel movements have been normal including a normal BM earlier today prior to the onset of these symptoms. He has not had any fevers and has been feeling generally well otherwise.  Unfortunately patient did develop nausea and vomiting once roomed in the ED.       Home Medications Prior to Admission medications   Medication Sig Start Date End Date Taking? Authorizing Provider  dicyclomine (BENTYL) 10 MG capsule Take 10 mg by mouth daily as needed (abdominal cramping). 10/22/21   [provider]  ondansetron (ZOFRAN-ODT) 4 MG disintegrating tablet Take 1 tablet (4 mg total) by mouth every 8 (eight) hours as needed for nausea or vomiting. 05/23/22   Schutt, Edsel Petrin, PA-C      Allergies    Patient has no known allergies.    Review of Systems   Review of Systems  Constitutional:  Negative for activity change, appetite change, chills and fatigue.  Gastrointestinal:  Positive for abdominal pain (generalized) and nausea. Negative for abdominal distention, anal bleeding, blood in stool, constipation, diarrhea and vomiting.    Physical Exam Updated Vital Signs BP 120/81   Pulse 61   Temp 98 F (36.7 C) (Oral)   Resp (!) 25   SpO2 100%  Physical Exam Constitutional:      Comments: Uncomfortable appearing but non-toxic. Standing doubled over at edge of bed but able to  converse appropriately.   HENT:     Mouth/Throat:     Mouth: Mucous membranes are moist.  Eyes:     Extraocular Movements: Extraocular movements intact.     Pupils: Pupils are equal, round, and reactive to light.  Cardiovascular:     Rate and Rhythm: Normal rate and regular rhythm.     Heart sounds: No murmur heard. Pulmonary:     Effort: Pulmonary effort is normal. No respiratory distress.     Breath sounds: No wheezing or rales.  Abdominal:     Palpations: Abdomen is soft.     Comments: Generalized tenderness with voluntary guarding without rebound. There is no organomegaly or mass.   Musculoskeletal:     Comments: Without edema or deformity   Skin:    General: Skin is warm and dry.     ED Results / Procedures / Treatments   Labs (all labs ordered are listed, but only abnormal results are displayed) Labs Reviewed  LIPASE, BLOOD - Abnormal; Notable for the following components:      Result Value   Lipase <10 (*)    All other components within normal limits  COMPREHENSIVE METABOLIC PANEL - Abnormal; Notable for the following components:   Glucose, Bld 116 (*)    Albumin 5.2 (*)    All other components within normal limits  URINALYSIS, ROUTINE W REFLEX MICROSCOPIC - Abnormal; Notable for the following components:   Specific Gravity, Urine >1.046 (*)  Ketones, ur 15 (*)    Protein, ur 30 (*)    All other components within normal limits  CBC    EKG None  Radiology CT ABDOMEN PELVIS W CONTRAST Result Date: 06/15/2023 CLINICAL DATA:  Abdominal pain. EXAM: CT ABDOMEN AND PELVIS WITH CONTRAST TECHNIQUE: Multidetector CT imaging of the abdomen and pelvis was performed using the standard protocol following bolus administration of intravenous contrast. RADIATION DOSE REDUCTION: This exam was performed according to the departmental dose-optimization program which includes automated exposure control, adjustment of the mA and/or kV according to patient size and/or use of iterative  reconstruction technique. CONTRAST:  OMNIPAQUE IOHEXOL 300 MG/ML  SOLN COMPARISON:  May 23, 2022 and October 23, 2021 FINDINGS: Lower chest: No acute abnormality. Hepatobiliary: No focal liver abnormality is seen. No gallstones, gallbladder wall thickening, or biliary dilatation. Pancreas: Unremarkable. No pancreatic ductal dilatation or surrounding inflammatory changes. Spleen: Normal in size without focal abnormality. Adrenals/Urinary Tract: Adrenal glands are unremarkable. Kidneys are normal, without renal calculi, focal lesion, or hydronephrosis. Bladder is unremarkable. Stomach/Bowel: Stomach is within normal limits. The appendix is surgically absent. A stable bowel pattern is noted, with the majority of small bowel seen within the right abdomen and large bowel seen within the left abdomen. 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 No evidence of bowel wall thickening, distention, or inflammatory changes. Vascular/Lymphatic: No significant vascular findings are present. No enlarged abdominal or pelvic lymph nodes. Reproductive: Prostate is unremarkable. Other: No abdominal wall hernia or abnormality. No abdominopelvic ascites. Musculoskeletal: No acute or significant osseous findings. IMPRESSION: 1. Stable findings consistent with malrotation, without evidence of bowel obstruction. 2. Evidence of prior appendectomy. Electronically Signed   By: Aram Candela M.D.   On: 06/15/2023 18:58    Procedures Procedures   Medications Ordered in ED Medications  ondansetron (ZOFRAN-ODT) disintegrating tablet 4 mg (4 mg Oral Given 06/15/23 1509)  HYDROmorphone (DILAUDID) injection 1 mg (1 mg Intravenous Given 06/15/23 1550)  iohexol (OMNIPAQUE) 300 MG/ML solution 100 mL (100 mLs Intravenous Contrast Given 06/15/23 1633)  ondansetron (ZOFRAN) injection 4 mg (4 mg Intravenous  Given 06/15/23 1825)  lactated ringers bolus 1,000 mL (0 mLs Intravenous Stopped 06/15/23 1954)  dicyclomine (BENTYL) capsule 10 mg (10 mg Oral Given 06/15/23 1949)  HYDROmorphone (DILAUDID) injection 1 mg (1 mg Intravenous Given 06/15/23 1949)  prochlorperazine (COMPAZINE) injection 10 mg (10 mg Intravenous Given 06/15/23 2249)    ED Course/ Medical Decision Making/ A&P Clinical Course as of 06/15/23 2254  Mon Jun 15, 2023  1602 43 yo male w/ pmh pSBO presenting for diffuse, acute onset, severe abdominal pain that started at 1pm with as. N/v. Last BM today and described as normal.  [AG]    Clinical Course User Index [AG] Franne Forts, DO    Medical Decision Making Differential for this presentation includes recurrence of SBO vs gastroenteritis vs volvulus vs gastroparesis vs cholecystitis vs pancreatitis vs acute metabolic derangement such as DKA. Lower concern for acute infection given no fevers or elevated white count on initial labs. Lipase WNL. Chemistry is without acute metabolic derangement.  CT of the Abdomen and Pelvis redemonstrates stable malrotation but is negative for acute process. Initially treated symptomatically with dilaudid, Zofran, and IV fluids.   1910 - Reassessed patient after Dilaudid x1, Zofran x2, and 1 L LR. He is still having pain and has  had two episodes of vomiting. He is hopeful to avoid the need for hospital admission. Shared decision-making to re-dose Dilaudid, add Bentyl, and PO challenge with chicken broth.   2109- Patient vomited up the chicken broth after PO challenge, not feeling up to discharging home. Case discussed with Dr. Janalyn Shy, [facility] who accepts the patient for admission for observation for intractable nausea/vomiting/abdominal pain. Compazine 10mg  IV x1 ordered. QTc 428.    Amount and/or Complexity of Data Reviewed Labs: ordered. Radiology: ordered.  Risk Prescription drug management. Decision regarding hospitalization.    Final Clinical  Impression(s) / ED Diagnoses Final diagnoses:  Intractable nausea and vomiting  Generalized abdominal pain    Rx / DC Orders ED Discharge Orders     None      J Dorothyann Gibbs, MD 06/15/23    Alicia Amel, MD 06/15/23 2254    Edwin Dada P, DO 06/20/23 1733

## 2023-06-15 NOTE — ED Triage Notes (Signed)
 Pt c/o "an issue w my abdomen- onset of pain that started approx 1.5hrs ago, diffuse/ everywhere." Hospitalized for same "a couple years ago, a partial obstruction on CT- NG tube & rest." States bentyl helps w symptoms, could not find home RX

## 2023-06-15 NOTE — Care Plan (Signed)
 Plan of Care Note for accepted transfer  Patient: Larry White              XBM:[id number]  DOA: 06/15/2023     Facility requesting transfer: [facility] emergency department Requesting Provider: Dr. Wallace Cullens  Reason for transfer: Intractable nausea vomiting  Facility course: 43 year old man history of malrotation and midgut volvulus as an infant who has been presenting to the emergency department complaining of abdominal pain for 1 and half hour which is diffuse, intractable nausea and vomiting.  Patient reported that at the baseline he has chronic nausea and he is used to it but this time he is having severe nausea and vomiting and unable to tolerate any even liquid food by mouth.  At presentation to ED patient is hemodynamically stable.  CT abdomen pelvis abdomen pelvis showing stable consistent malrotation without any evidence small bowel obstruction.  Evidence of prior appendectomy. CBC unremarkable. CMP unremarkable. Normal lipase level.  In the ED patient received 1 L of bolus, fentanyl, Dilaudid and Zofran x 2. Patient continuing having nausea and not very comfortable going home given he has poor oral tolerance of diet.  Hospitalist has been consulted for further evaluation and management of intractable nausea and vomiting.   Plan of care: The patient is accepted for admission for observation status to Med-surg  unit, at [facility].  Check [email or web address] for on-call coverage.  [facility] will assume care on arrival to accepting facility. Until arrival, medical decision making responsibilities remain with the EDP.  However, [facility] available 24/7 for questions and assistance.   Nursing staff please page [facility] Admits and Consults [phone]) as soon as the patient arrives to the hospital.    Author: Tereasa Coop, MD  06/15/2023  [facility]

## 2023-06-16 ENCOUNTER — Encounter (HOSPITAL_COMMUNITY): Payer: Self-pay | Admitting: Internal Medicine

## 2023-06-16 DIAGNOSIS — R112 Nausea with vomiting, unspecified: Principal | ICD-10-CM

## 2023-06-16 DIAGNOSIS — Z79899 Other long term (current) drug therapy: Secondary | ICD-10-CM | POA: Diagnosis not present

## 2023-06-16 DIAGNOSIS — Z87891 Personal history of nicotine dependence: Secondary | ICD-10-CM | POA: Diagnosis not present

## 2023-06-16 LAB — CBC
HCT: 40.6 % (ref 39.0–52.0)
Hemoglobin: 13.7 g/dL (ref 13.0–17.0)
MCH: 30.2 pg (ref 26.0–34.0)
MCHC: 33.7 g/dL (ref 30.0–36.0)
MCV: 89.6 fL (ref 80.0–100.0)
Platelets: 214 10*3/uL (ref 150–400)
RBC: 4.53 MIL/uL (ref 4.22–5.81)
RDW: 11.6 % (ref 11.5–15.5)
WBC: 10.1 10*3/uL (ref 4.0–10.5)
nRBC: 0 % (ref 0.0–0.2)

## 2023-06-16 LAB — BASIC METABOLIC PANEL
Anion gap: 9 (ref 5–15)
BUN: 15 mg/dL (ref 6–20)
CO2: 27 mmol/L (ref 22–32)
Calcium: 9.7 mg/dL (ref 8.9–10.3)
Chloride: 103 mmol/L (ref 98–111)
Creatinine, Ser: 1.01 mg/dL (ref 0.61–1.24)
GFR, Estimated: >60 mL/min (ref >60)
Glucose, Bld: 136 mg/dL — ABNORMAL HIGH (ref 70–99)
Potassium: 4.4 mmol/L (ref 3.5–5.1)
Sodium: 139 mmol/L (ref 135–145)

## 2023-06-16 MED ORDER — HYDROMORPHONE HCL 1 MG/ML IJ SOLN
1.0000 mg | Freq: Once | INTRAMUSCULAR | Status: AC
  Administered 2023-06-16: 1 mg via INTRAVENOUS
  Filled 2023-06-16: qty 1

## 2023-06-16 MED ORDER — DICYCLOMINE HCL 10 MG PO CAPS: 10.0000 mg | ORAL_CAPSULE | Freq: Every day | ORAL | Status: DC | PRN

## 2023-06-16 MED ORDER — BISACODYL 5 MG PO TBEC: 5.0000 mg | DELAYED_RELEASE_TABLET | Freq: Every day | ORAL | Status: DC | PRN

## 2023-06-16 MED ORDER — POLYETHYLENE GLYCOL 3350 17 G PO PACK: 17.0000 g | PACK | Freq: Every day | ORAL | Status: DC | PRN

## 2023-06-16 MED ORDER — DEXTROSE-SODIUM CHLORIDE 5-0.45 % IV SOLN: INTRAVENOUS | Status: DC

## 2023-06-16 MED ORDER — ONDANSETRON HCL 4 MG/2ML IJ SOLN
4.0000 mg | Freq: Four times a day (QID) | INTRAMUSCULAR | Status: DC | PRN
  Administered 2023-06-16: 4 mg via INTRAVENOUS
  Filled 2023-06-16: qty 2

## 2023-06-16 MED ORDER — ONDANSETRON 4 MG PO TBDP: 4.0000 mg | ORAL_TABLET | Freq: Three times a day (TID) | ORAL | 0 refills | Status: AC | PRN

## 2023-06-16 MED ORDER — HYDRALAZINE HCL 20 MG/ML IJ SOLN: 10.0000 mg | Freq: Four times a day (QID) | INTRAMUSCULAR | Status: DC | PRN

## 2023-06-16 MED ORDER — ACETAMINOPHEN 325 MG PO TABS: 650.0000 mg | ORAL_TABLET | Freq: Four times a day (QID) | ORAL | Status: DC | PRN

## 2023-06-16 MED ORDER — ENOXAPARIN SODIUM 40 MG/0.4ML IJ SOSY
40.0000 mg | PREFILLED_SYRINGE | INTRAMUSCULAR | Status: DC
  Filled 2023-06-16: qty 0.4

## 2023-06-16 MED ORDER — ALBUTEROL SULFATE (2.5 MG/3ML) 0.083% IN NEBU: 2.5000 mg | INHALATION_SOLUTION | Freq: Four times a day (QID) | RESPIRATORY_TRACT | Status: DC | PRN

## 2023-06-16 MED ORDER — HYDROMORPHONE HCL 1 MG/ML IJ SOLN
1.0000 mg | INTRAMUSCULAR | Status: DC | PRN
  Administered 2023-06-16: 1 mg via INTRAVENOUS
  Filled 2023-06-16: qty 1

## 2023-06-16 NOTE — H&P (Signed)
 [facility] History and physical  AREG BIALAS ZOX:[id number] DOB: Jul 18, 1980 DOA: 06/15/2023 PCP: Eden Emms, NP  Presented from: Home Chief Complaint: Nausea, vomiting  History of Present Illness: Larry White is a 43 y.o. 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 3/3, patient presented to ED at [facility] after an episode of diffuse, intractable nausea and vomiting. Because of his congenital issue and surgical history, he has had SBO in the past.  Last hospitalized with SBO in July 2023.  Never required surgery for SBO.  In the ED, patient was hemodynamically stable CT abdomen pelvis showed stable consistent malrotation without any evidence of SBO. CBC, CMP, lipase unremarkable  In the ED, patient was given IV Dilaudid, IV Zofran, IV fluid Accepted for observation under TRH at [facility]  I received this patient as a carryover admission this morning.  At the time of my evaluation, patient was lying on bed.  Not in distress.  Vomited 1 time last night.  Able to tolerate clear liquid diet.   Passing gas.  Last bowel meant was prior to presentation yesterday.  Mother at bedside.  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 His smoking use included cigarettes. He started smoking about 27 years ago. He has a 5.5  pack-year smoking history. He has never used smokeless tobacco. He reports that he does not currently use alcohol. He reports that he does not currently use drugs.  Allergies:  No Known Allergies Patient has no known allergies.   Family history:  Family History  Problem Relation Age of Onset   Hyperlipidemia Mother    Hypertension Mother    Diabetes Father    Hyperlipidemia Maternal Grandmother    Heart disease Maternal Grandmother        A fib and CHF   Heart disease Paternal Grandfather      Physical Exam: Vitals:   06/16/23 0100 06/16/23 0200 06/16/23 0359 06/16/23 0420  BP: 118/83 124/60 (!) 145/82   Pulse: 66 78 (!) 59   Resp: 18 18 18    Temp:   (!) 97.5 F (36.4 C)   TempSrc:   Oral   SpO2: 100% 100% 100%   Weight:    91.5 kg  Height:    5\' 11"  (1.803 m)   Wt Readings from Last 3 Encounters:  06/16/23 91.5 kg  08/13/22 91.2 kg  05/23/22 88.5 kg   Body mass index is 28.13 kg/m.  General exam: Pleasant, young Caucasian male. Skin: No rashes, lesions or ulcers. HEENT: Atraumatic, normocephalic, no obvious bleeding Lungs: Clear to auscultation bilaterally,  CVS: S1, S2, no murmur,   GI/Abd: Soft, nontender, nondistended, bowel sound present,   CNS: Alert, awake, oriented x 3 Psychiatry: Mood appropriate,  Extremities: No pedal edema,  no calf tenderness,    ----------------------------------------------------------------------------------------------------------------------------------------- ----------------------------------------------------------------------------------------------------------------------------------------- -----------------------------------------------------------------------------------------------------------------------------------------  Assessment/Plan: Principal Problem:   Intractable nausea and vomiting  Intractable nausea and vomiting In the setting of congenital malrotation of intestine No evidence of bowel  obstruction Nausea vomiting improving with symptomatic management.  Does not look significantly dehydrated Passing gas. Clear liquid diet for today Gradually advance  Mobility: Encourage ambulation  Goals of care:   Code Status: Full Code    DVT prophylaxis:  enoxaparin (LOVENOX) injection 40 mg Start: 06/16/23 0845   Antimicrobials: None Family Communication: Mother at bedside  Dispo: The patient is from: Home           Diet: Diet Order             Diet clear liquid Room service appropriate? Yes; Fluid consistency: Thin  Diet effective now                    ------------------------------------------------------------------------------------- Severity of Illness: The appropriate patient status for this patient is OBSERVATION. Observation status is judged to be reasonable and necessary in order to provide the required intensity of service to ensure the patient's safety. The patient's presenting symptoms, physical exam findings, and initial radiographic and laboratory data in the context of their medical condition is felt to place them at decreased risk for further clinical deterioration. Furthermore, it is anticipated that the patient will be medically stable for discharge from the hospital within 2 midnights of admission.  -------------------------------------------------------------------------------------   Home Meds: Prior to Admission medications   Medication Sig Start Date End Date Taking? Authorizing Provider  dicyclomine (BENTYL) 10 MG capsule Take 10 mg by mouth daily as needed (abdominal cramping). 10/22/21  Yes [provider]  ondansetron (ZOFRAN-ODT) 4 MG disintegrating tablet Take 1 tablet (4 mg total) by mouth every 8 (eight) hours as needed for nausea or vomiting. 05/23/22   Michelle Piper, PA-C    Labs on Admission:   CBC: Recent Labs  Lab 06/15/23 1449 06/16/23 0653  WBC 6.9 10.1  HGB 14.6 13.7  HCT 43.6 40.6  MCV 89.2 89.6  PLT  244 214    Basic Metabolic Panel: Recent Labs  Lab 06/15/23 1449  NA 140  K 4.0  CL 101  CO2 28  GLUCOSE 116*  BUN 16  CREATININE 1.23  CALCIUM 9.6    Liver Function Tests: Recent Labs  Lab 06/15/23 1449  AST 20  ALT 25  ALKPHOS 58  BILITOT 0.4  PROT 7.8  ALBUMIN 5.2*   Recent Labs  Lab 06/15/23 1449  LIPASE <10*   No results for input(s): "AMMONIA" in the last 168 hours.  Cardiac Enzymes: No results for input(s): "CKTOTAL", "CKMB", "CKMBINDEX", "TROPONINI" in the last 168 hours.  BNP (last 3 results) No results for input(s): "BNP" in the last 8760 hours.  ProBNP (last 3 results) No results for input(s): "PROBNP" in the last 8760 hours.  CBG: No results for input(s): "GLUCAP" in the last 168 hours.  Lipase     Component Value Date/Time   LIPASE <10 (L) 06/15/2023 1449     Urinalysis    Component Value Date/Time   COLORURINE YELLOW 06/15/2023 1848   APPEARANCEUR CLEAR 06/15/2023 1848   LABSPEC >1.046 (H) 06/15/2023 1848   PHURINE 7.5 06/15/2023 1848   GLUCOSEU NEGATIVE 06/15/2023 1848   HGBUR NEGATIVE 06/15/2023 1848   BILIRUBINUR NEGATIVE 06/15/2023 1848   KETONESUR 15 (A) 06/15/2023 1848   PROTEINUR 30 (A) 06/15/2023 1848   UROBILINOGEN 0.2 02/21/2014 1425  NITRITE NEGATIVE 06/15/2023 1848   LEUKOCYTESUR NEGATIVE 06/15/2023 1848     Drugs of Abuse  No results found for: "LABOPIA", "COCAINSCRNUR", "LABBENZ", "AMPHETMU", "THCU", "LABBARB"    Radiological Exams on Admission: CT ABDOMEN PELVIS W CONTRAST Result Date: 06/15/2023 CLINICAL DATA:  Abdominal pain. EXAM: CT ABDOMEN AND PELVIS WITH CONTRAST TECHNIQUE: Multidetector CT imaging of the abdomen and pelvis was performed using the standard protocol following bolus administration of intravenous contrast. RADIATION DOSE REDUCTION: This exam was performed according to the departmental dose-optimization program which includes automated exposure control, adjustment of the mA and/or kV according  to patient size and/or use of iterative reconstruction technique. CONTRAST:  OMNIPAQUE IOHEXOL 300 MG/ML  SOLN COMPARISON:  May 23, 2022 and October 23, 2021 FINDINGS: Lower chest: No acute abnormality. Hepatobiliary: No focal liver abnormality is seen. No gallstones, gallbladder wall thickening, or biliary dilatation. Pancreas: Unremarkable. No pancreatic ductal dilatation or surrounding inflammatory changes. Spleen: Normal in size without focal abnormality. Adrenals/Urinary Tract: Adrenal glands are unremarkable. Kidneys are normal, without renal calculi, focal lesion, or hydronephrosis. Bladder is unremarkable. Stomach/Bowel: Stomach is within normal limits. The appendix is surgically absent. A stable bowel pattern is noted, with the majority of small bowel seen within the right abdomen and large bowel seen within the left abdomen. 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 No evidence of bowel wall thickening, distention, or inflammatory changes. Vascular/Lymphatic: No significant vascular findings are present. No enlarged abdominal or pelvic lymph nodes. Reproductive: Prostate is unremarkable. Other: No abdominal wall hernia or abnormality. No abdominopelvic ascites. Musculoskeletal: No acute or significant osseous findings. IMPRESSION: 1. Stable findings consistent with malrotation, without evidence of bowel obstruction. 2. Evidence of prior appendectomy. Electronically Signed   By: Aram Candela M.D.   On: 06/15/2023 18:58     Signed, Lorin Glass, MD [facility] 06/16/2023

## 2023-06-16 NOTE — Plan of Care (Signed)

## 2023-06-16 NOTE — Discharge Summary (Addendum)
 Physician Discharge Summary  [name] WJX:[id number] DOB: 11-03-1980 DOA: 06/15/2023  PCP: Eden Emms, NP  Admit date: 06/15/2023 Discharge date: 06/16/2023  Admitted From: Home Discharge disposition: Home  Recommendations at discharge:  Stay on clear liquid diet for 1 to 2 days.  Gradually advance as tolerated.  Brief narrative: Larry White is a 43 y.o. 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 3/3, patient presented to ED at [facility] after an episode of diffuse, intractable nausea and vomiting. Because of his congenital issue and surgical history, he has had SBO in the past.  Last hospitalized with SBO in July 2023.  Never required surgery for SBO.   In the ED, patient was hemodynamically stable CT abdomen pelvis showed stable consistent malrotation without any evidence of SBO. CBC, CMP, lipase unremarkable   In the ED, patient was given IV Dilaudid, IV Zofran, IV fluid Accepted for observation under TRH at [facility]   I received this patient as a carryover admission this morning.   At the time of my evaluation, patient was lying on bed.  Not in distress.  Vomited 1 time last night.  Able to tolerate clear liquid diet.   Passing gas.  Last bowel movement was prior to presentation yesterday.  Mother at bedside. Patient feels better enough to go home.  Mom agrees. Since patient has been dealing with this for a long time, he is aware that he will stay on clear liquid diet for 1 to 2 days and gradually advance as tolerated.  If symptoms worsen, patient will return back to ED    Mobility: Encourage ambulation  Goals of care   Code Status: Full Code    Diet:  Diet Order             Diet clear liquid Room service appropriate? Yes; Fluid consistency: Thin  Diet effective now           Diet general                   Nutritional status:  Body mass index is 28.13 kg/m.         Wounds:  -    Discharge Exam:   Vitals:   06/16/23 0100 06/16/23 0200 06/16/23 0359 06/16/23 0420  BP: 118/83 124/60 (!) 145/82   Pulse: 66 78 (!) 59   Resp: 18 18 18    Temp:   (!) 97.5 F (36.4 C)   TempSrc:   Oral   SpO2: 100% 100% 100%   Weight:    91.5 kg  Height:    5\' 11"  (1.803 m)    Body mass index is 28.13 kg/m.  General exam: Pleasant, young. Skin: No rashes, lesions or ulcers. HEENT: Atraumatic, normocephalic, no obvious bleeding Lungs: Clear to auscultation bilaterally,  CVS: S1, S2, no murmur,   GI/Abd: Soft, nontender, nondistended, bowel sound present,   CNS: Alert, awake, oriented x 3 Psychiatry: Mood appropriate,  Extremities: No pedal edema, no calf tenderness,   Follow ups:    Follow-up Information     Eden Emms, NP Follow up.   Specialties: Nurse Practitioner, Family Medicine Contact information: [address] [place] [address] [phone]                 Discharge Instructions:   Discharge Instructions     Call MD for:  difficulty breathing, headache or visual disturbances   Complete  by: As directed    Call MD for:  extreme fatigue   Complete by: As directed    Call MD for:  hives   Complete by: As directed    Call MD for:  persistant dizziness or light-headedness   Complete by: As directed    Call MD for:  persistant nausea and vomiting   Complete by: As directed    Call MD for:  severe uncontrolled pain   Complete by: As directed    Call MD for:  temperature >100.4   Complete by: As directed    Diet general   Complete by: As directed    Discharge instructions   Complete by: As directed    Recommendations at discharge:   Stay on clear liquid diet for 1 to 2 days.  Gradually advance as tolerated.  General discharge instructions: Follow with Primary MD Eden Emms, NP in 7 days  Please request your PCP  to go over your hospital tests, procedures, radiology results at the follow up. Please get  your medicines reviewed and adjusted.  Your PCP may decide to repeat certain labs or tests as needed. Do not drive, operate heavy machinery, perform activities at heights, swimming or participation in water activities or provide baby sitting services if your were admitted for syncope or siezures until you have seen by Primary MD or a Neurologist and advised to do so again. [place] Controlled Substance Reporting System database was reviewed. Do not drive, operate heavy machinery, perform activities at heights, swim, participate in water activities or provide baby-sitting services while on medications for pain, sleep and mood until your outpatient physician has reevaluated you and advised to do so again.  You are strongly recommended to comply with the dose, frequency and duration of prescribed medications. Activity: As tolerated with Full fall precautions use walker/cane & assistance as needed Avoid using any recreational substances like cigarette, tobacco, alcohol, or non-prescribed drug. If you experience worsening of your admission symptoms, develop shortness of breath, life threatening emergency, suicidal or homicidal thoughts you must seek medical attention immediately by calling 911 or calling your MD immediately  if symptoms less severe. You must read complete instructions/literature along with all the possible adverse reactions/side effects for all the medicines you take and that have been prescribed to you. Take any new medicine only after you have completely understood and accepted all the possible adverse reactions/side effects.  Wear Seat belts while driving. You were cared for by a hospitalist during your hospital stay. If you have any questions about your discharge medications or the care you received while you were in the hospital after you are discharged, you can call the unit and ask to speak with the hospitalist or the covering physician. Once you are discharged, your primary care  physician will handle any further medical issues. Please note that NO REFILLS for any discharge medications will be authorized once you are discharged, as it is imperative that you return to your primary care physician (or establish a relationship with a primary care physician if you do not have one).   Increase activity slowly   Complete by: As directed        Discharge Medications:   Allergies as of 06/16/2023   No Known Allergies      Medication List     TAKE these medications    dicyclomine 10 MG capsule Commonly known as: BENTYL Take 10 mg by mouth daily as needed (abdominal cramping).   ondansetron 4 MG  disintegrating tablet Commonly known as: ZOFRAN-ODT Take 1 tablet (4 mg total) by mouth every 8 (eight) hours as needed for nausea or vomiting.         The results of significant diagnostics from this hospitalization (including imaging, microbiology, ancillary and laboratory) are listed below for reference.    Procedures and Diagnostic Studies:   CT ABDOMEN PELVIS W CONTRAST Result Date: 06/15/2023 CLINICAL DATA:  Abdominal pain. EXAM: CT ABDOMEN AND PELVIS WITH CONTRAST TECHNIQUE: Multidetector CT imaging of the abdomen and pelvis was performed using the standard protocol following bolus administration of intravenous contrast. RADIATION DOSE REDUCTION: This exam was performed according to the departmental dose-optimization program which includes automated exposure control, adjustment of the mA and/or kV according to patient size and/or use of iterative reconstruction technique. CONTRAST:  OMNIPAQUE IOHEXOL 300 MG/ML  SOLN COMPARISON:  May 23, 2022 and October 23, 2021 FINDINGS: Lower chest: No acute abnormality. Hepatobiliary: No focal liver abnormality is seen. No gallstones, gallbladder wall thickening, or biliary dilatation. Pancreas: Unremarkable. No pancreatic ductal dilatation or surrounding inflammatory changes. Spleen: Normal in size without focal abnormality.  Adrenals/Urinary Tract: Adrenal glands are unremarkable. Kidneys are normal, without renal calculi, focal lesion, or hydronephrosis. Bladder is unremarkable. Stomach/Bowel: Stomach is within normal limits. The appendix is surgically absent. A stable bowel pattern is noted, with the majority of small bowel seen within the right abdomen and large bowel seen within the left abdomen. 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 No evidence of bowel wall thickening, distention, or inflammatory changes. Vascular/Lymphatic: No significant vascular findings are present. No enlarged abdominal or pelvic lymph nodes. Reproductive: Prostate is unremarkable. Other: No abdominal wall hernia or abnormality. No abdominopelvic ascites. Musculoskeletal: No acute or significant osseous findings. IMPRESSION: 1. Stable findings consistent with malrotation, without evidence of bowel obstruction. 2. Evidence of prior appendectomy. Electronically Signed   By: Aram Candela M.D.   On: 06/15/2023 18:58     Labs:   Basic Metabolic Panel: Recent Labs  Lab 06/15/23 1449 06/16/23 0653  NA 140 139  K 4.0 4.4  CL 101 103  CO2 28 27  GLUCOSE 116* 136*  BUN 16 15  CREATININE 1.23 1.01  CALCIUM 9.6 9.7   GFR Estimated Creatinine Clearance: 110.2 mL/min (by C-G formula based on SCr of 1.01 mg/dL). Liver Function Tests: Recent Labs  Lab 06/15/23 1449  AST 20  ALT 25  ALKPHOS 58  BILITOT 0.4  PROT 7.8  ALBUMIN 5.2*   Recent Labs  Lab 06/15/23 1449  LIPASE <10*   No results for input(s): "AMMONIA" in the last 168 hours. Coagulation profile No results for input(s): "INR", "PROTIME" in the last 168 hours.  CBC: Recent Labs  Lab 06/15/23 1449 06/16/23 0653  WBC 6.9 10.1  HGB 14.6 13.7  HCT 43.6 40.6  MCV 89.2 89.6  PLT 244 214   Cardiac Enzymes: No results for input(s):  "CKTOTAL", "CKMB", "CKMBINDEX", "TROPONINI" in the last 168 hours. BNP: Invalid input(s): "POCBNP" CBG: No results for input(s): "GLUCAP" in the last 168 hours. D-Dimer No results for input(s): "DDIMER" in the last 72 hours. Hgb A1c No results for input(s): "HGBA1C" in the last 72 hours. Lipid Profile No results for input(s): "CHOL", "HDL", "LDLCALC", "TRIG", "CHOLHDL", "LDLDIRECT" in the last 72 hours. Thyroid function studies No results for input(s): "TSH", "T4TOTAL", "T3FREE", "THYROIDAB" in the last  72 hours.  Invalid input(s): "FREET3" Anemia work up No results for input(s): "VITAMINB12", "FOLATE", "FERRITIN", "TIBC", "IRON", "RETICCTPCT" in the last 72 hours. Microbiology No results found for this or any previous visit (from the past 240 hours).   Signed: Lorin Glass  [facility] 06/16/2023, 9:25 AM

## 2023-06-16 NOTE — Progress Notes (Signed)
 Pt received to [facility] room 10 from EMS transport from [facility] ED.  Pt oriented to room and call bell.  See assessment and vs's.  See admission data base and assessment.  Pt unable to ly on his back with abd cramping and nausea.  Dilaudid and zofran given IVP.  Pt on his elbows and knees facing down in bed.  After a bit, he was able to turn onto his back long enough for EKG 12 lead to be done.  MD notified of pt's arrival.

## 2023-06-16 NOTE — ED Notes (Signed)
 ED TO INPATIENT HANDOFF REPORT  ED Nurse Name and Phone #:   S Name/Age/Gender Larry White 43 y.o. male Room/Bed: DB012/DB012  Code Status   Code Status: Prior  Home/SNF/Other Home Patient oriented to: self, place, time, and situation Is this baseline? Yes   Triage Complete: Triage complete  Chief Complaint Intractable nausea and vomiting [R11.2]  Triage Note Pt c/o "an issue w my abdomen- onset of pain that started approx 1.5hrs ago, diffuse/ everywhere." Hospitalized for same "a couple years ago, a partial obstruction on CT- NG tube & rest." States bentyl helps w symptoms, could not find home RX   Allergies No Known Allergies  Level of Care/Admitting Diagnosis ED Disposition     ED Disposition  Admit   Condition  --   Comment  Hospital Area: [facility] [[id number]]  Level of Care: Med-Surg [16]  Interfacility transfer: Yes  May place patient in observation at [facility] or [facility] if equivalent level of care is available:: Yes  Covid Evaluation: Asymptomatic - no recent exposure (last 10 days) testing not required  Diagnosis: Intractable nausea and vomiting [720114]  Admitting Physician: Tereasa Coop [[id number]]  Attending Physician: Tereasa Coop [[id number]]          B Medical/Surgery History Past Medical History:  Diagnosis Date   Allergic rhinitis due to pollen    Intestinal malrotation    Sebaceous cyst    back   Unspecified hemorrhoids without mention of complication    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   Past Surgical History:  Procedure Laterality Date   APPENDECTOMY  1982   APPENDECTOMY     COLON SURGERY     VOLVULUS REDUCTION  1982   Probable volvulus in 1982 as infant     A IV Location/Drains/Wounds Patient Lines/Drains/Airways Status     Active Line/Drains/Airways     Name Placement date Placement time Site Days   Peripheral IV 06/15/23 22 G 1" Anterior;Right;Upper Arm 06/15/23  1832  Arm  1             Intake/Output Last 24 hours  Intake/Output Summary (Last 24 hours) at 06/16/2023 0305 Last data filed at 06/15/2023 1954 Gross per 24 hour  Intake 1000 ml  Output --  Net 1000 ml    Labs/Imaging Results for orders placed or performed during the hospital encounter of 06/15/23 (from the past 48 hours)  Lipase, blood     Status: Abnormal   Collection Time: 06/15/23  2:49 PM  Result Value Ref Range   Lipase <10 (L) 11 - 51 U/L    Comment: Performed at [facility], [address] [place], [place], [place] [address]  Comprehensive metabolic panel     Status: Abnormal   Collection Time: 06/15/23  2:49 PM  Result Value Ref Range   Sodium 140 135 - 145 mmol/L   Potassium 4.0 3.5 - 5.1 mmol/L   Chloride 101 98 - 111 mmol/L   CO2 28 22 - 32 mmol/L   Glucose, Bld 116 (H) 70 - 99 mg/dL    Comment: Glucose reference range applies only to samples taken after fasting for at least 8 hours.   BUN 16 6 - 20 mg/dL   Creatinine, Ser 8.29 0.61 - 1.24 mg/dL   Calcium 9.6 8.9 - 56.2 mg/dL   Total Protein 7.8 6.5 - 8.1 g/dL   Albumin 5.2 (H) 3.5 - 5.0 g/dL   AST 20 15 - 41 U/L   ALT 25 0 -  44 U/L   Alkaline Phosphatase 58 38 - 126 U/L   Total Bilirubin 0.4 0.0 - 1.2 mg/dL   GFR, Estimated >16 >10 mL/min    Comment: (NOTE) Calculated using the CKD-EPI Creatinine Equation (2021)    Anion gap 11 5 - 15    Comment: Performed at [facility], [address] [place], [place] [address]  CBC     Status: None   Collection Time: 06/15/23  2:49 PM  Result Value Ref Range   WBC 6.9 4.0 - 10.5 K/uL   RBC 4.89 4.22 - 5.81 MIL/uL   Hemoglobin 14.6 13.0 - 17.0 g/dL   HCT 40.9 81.1 - 91.4 %   MCV 89.2 80.0 - 100.0 fL   MCH 29.9 26.0 - 34.0 pg   MCHC 33.5 30.0 - 36.0 g/dL   RDW 78.2 95.6 - 21.3 %   Platelets 244 150 - 400 K/uL   nRBC 0.0 0.0 - 0.2 %    Comment: Performed at [facility], [address] [place], [place] [address]   Urinalysis, Routine w reflex microscopic -Urine, Clean Catch     Status: Abnormal   Collection Time: 06/15/23  6:48 PM  Result Value Ref Range   Color, Urine YELLOW YELLOW   APPearance CLEAR CLEAR   Specific Gravity, Urine >1.046 (H) 1.005 - 1.030   pH 7.5 5.0 - 8.0   Glucose, UA NEGATIVE NEGATIVE mg/dL   Hgb urine dipstick NEGATIVE NEGATIVE   Bilirubin Urine NEGATIVE NEGATIVE   Ketones, ur 15 (A) NEGATIVE mg/dL   Protein, ur 30 (A) NEGATIVE mg/dL   Nitrite NEGATIVE NEGATIVE   Leukocytes,Ua NEGATIVE NEGATIVE   RBC / HPF 0-5 0 - 5 RBC/hpf   WBC, UA 0-5 0 - 5 WBC/hpf   Bacteria, UA NONE SEEN NONE SEEN   Squamous Epithelial / HPF 0-5 0 - 5 /HPF   Mucus PRESENT     Comment: Performed at [facility], [address] [place], [place] [address]   CT ABDOMEN PELVIS W CONTRAST Result Date: 06/15/2023 CLINICAL DATA:  Abdominal pain. EXAM: CT ABDOMEN AND PELVIS WITH CONTRAST TECHNIQUE: Multidetector CT imaging of the abdomen and pelvis was performed using the standard protocol following bolus administration of intravenous contrast. RADIATION DOSE REDUCTION: This exam was performed according to the departmental dose-optimization program which includes automated exposure control, adjustment of the mA and/or kV according to patient size and/or use of iterative reconstruction technique. CONTRAST:  OMNIPAQUE IOHEXOL 300 MG/ML  SOLN COMPARISON:  May 23, 2022 and October 23, 2021 FINDINGS: Lower chest: No acute abnormality. Hepatobiliary: No focal liver abnormality is seen. No gallstones, gallbladder wall thickening, or biliary dilatation. Pancreas: Unremarkable. No pancreatic ductal dilatation or surrounding inflammatory changes. Spleen: Normal in size without focal abnormality. Adrenals/Urinary Tract: Adrenal glands are unremarkable. Kidneys are normal, without renal calculi, focal lesion, or hydronephrosis. Bladder is unremarkable. Stomach/Bowel: Stomach is within normal limits.  The appendix is surgically absent. A stable bowel pattern is noted, with the majority of small bowel seen within the right abdomen and large bowel seen within the left abdomen. 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 No evidence of bowel wall thickening, distention, or inflammatory changes. Vascular/Lymphatic: No significant vascular findings are present. No enlarged abdominal or pelvic lymph nodes. Reproductive: Prostate is unremarkable. Other: No abdominal wall hernia or abnormality. No abdominopelvic ascites.  Musculoskeletal: No acute or significant osseous findings. IMPRESSION: 1. Stable findings consistent with malrotation, without evidence of bowel obstruction. 2. Evidence of prior appendectomy. Electronically Signed   By: Aram Candela M.D.   On: 06/15/2023 18:58    Pending Labs Unresulted Labs (From admission, onward)     Start     Ordered   06/16/23 0500  CBC  Tomorrow morning,   R        06/16/23 0221   06/16/23 0500  Basic metabolic panel  Tomorrow morning,   R        06/16/23 0221            Vitals/Pain Today's Vitals   06/16/23 0000 06/16/23 0100 06/16/23 0200 06/16/23 0215  BP: 113/82 118/83 124/60   Pulse: 66 66 78   Resp: 16 18 18    Temp:      TempSrc:      SpO2: 100% 100% 100%   PainSc:    6     Isolation Precautions No active isolations  Medications Medications  ondansetron (ZOFRAN) injection 4 mg (has no administration in time range)  HYDROmorphone (DILAUDID) injection 1 mg (has no administration in time range)  acetaminophen (TYLENOL) tablet 650 mg (has no administration in time range)  ondansetron (ZOFRAN-ODT) disintegrating tablet 4 mg (4 mg Oral Given 06/15/23 1509)  HYDROmorphone (DILAUDID) injection 1 mg (1 mg Intravenous Given 06/15/23 1550)  iohexol (OMNIPAQUE) 300 MG/ML solution 100 mL (100 mLs Intravenous Contrast  Given 06/15/23 1633)  ondansetron (ZOFRAN) injection 4 mg (4 mg Intravenous Given 06/15/23 1825)  lactated ringers bolus 1,000 mL (0 mLs Intravenous Stopped 06/15/23 1954)  dicyclomine (BENTYL) capsule 10 mg (10 mg Oral Given 06/15/23 1949)  HYDROmorphone (DILAUDID) injection 1 mg (1 mg Intravenous Given 06/15/23 1949)  prochlorperazine (COMPAZINE) injection 10 mg (10 mg Intravenous Given 06/15/23 2249)  HYDROmorphone (DILAUDID) injection 1 mg (1 mg Intravenous Given 06/16/23 0215)    Mobility walks     Focused Assessments    R Recommendations: See Admitting Provider Note  Report given to:   Additional Notes:

## 2023-08-14 ENCOUNTER — Encounter: Payer: Managed Care, Other (non HMO) | Admitting: Nurse Practitioner
